# Patient Record
Sex: Female | Born: 1985 | Race: White | Hispanic: No | Marital: Married | Smoking: Never smoker
Health system: Southern US, Community
[De-identification: ages and names within clinical notes are randomized; demographics above are authoritative.]

## PROBLEM LIST (undated history)

## (undated) DIAGNOSIS — I4891 Unspecified atrial fibrillation: Secondary | ICD-10-CM

## (undated) DIAGNOSIS — M797 Fibromyalgia: Secondary | ICD-10-CM

## (undated) DIAGNOSIS — J45909 Unspecified asthma, uncomplicated: Secondary | ICD-10-CM

## (undated) DIAGNOSIS — R7303 Prediabetes: Secondary | ICD-10-CM

## (undated) DIAGNOSIS — D649 Anemia, unspecified: Secondary | ICD-10-CM

## (undated) DIAGNOSIS — E7800 Pure hypercholesterolemia, unspecified: Secondary | ICD-10-CM

## (undated) DIAGNOSIS — F419 Anxiety disorder, unspecified: Secondary | ICD-10-CM

## (undated) DIAGNOSIS — F32A Depression, unspecified: Secondary | ICD-10-CM

## (undated) DIAGNOSIS — I10 Essential (primary) hypertension: Secondary | ICD-10-CM

## (undated) DIAGNOSIS — E282 Polycystic ovarian syndrome: Secondary | ICD-10-CM

## (undated) DIAGNOSIS — E669 Obesity, unspecified: Secondary | ICD-10-CM

## (undated) DIAGNOSIS — K589 Irritable bowel syndrome without diarrhea: Secondary | ICD-10-CM

## (undated) DIAGNOSIS — K219 Gastro-esophageal reflux disease without esophagitis: Secondary | ICD-10-CM

## (undated) DIAGNOSIS — T7840XA Allergy, unspecified, initial encounter: Secondary | ICD-10-CM

## (undated) DIAGNOSIS — I890 Lymphedema, not elsewhere classified: Secondary | ICD-10-CM

## (undated) DIAGNOSIS — M1990 Unspecified osteoarthritis, unspecified site: Secondary | ICD-10-CM

## (undated) HISTORY — DX: Unspecified atrial fibrillation: I48.91

## (undated) HISTORY — DX: Gastro-esophageal reflux disease without esophagitis: K21.9

## (undated) HISTORY — PX: CHOLECYSTECTOMY: SHX55

## (undated) HISTORY — DX: Unspecified osteoarthritis, unspecified site: M19.90

## (undated) HISTORY — DX: Polycystic ovarian syndrome: E28.2

## (undated) HISTORY — DX: Essential (primary) hypertension: I10

## (undated) HISTORY — DX: Fibromyalgia: M79.7

## (undated) HISTORY — DX: Irritable bowel syndrome, unspecified: K58.9

## (undated) HISTORY — DX: Anemia, unspecified: D64.9

## (undated) HISTORY — DX: Unspecified asthma, uncomplicated: J45.909

## (undated) HISTORY — DX: Anxiety disorder, unspecified: F41.9

## (undated) HISTORY — DX: Depression, unspecified: F32.A

## (undated) HISTORY — PX: DILATION AND CURETTAGE OF UTERUS: SHX78

## (undated) HISTORY — DX: Prediabetes: R73.03

## (undated) HISTORY — DX: Pure hypercholesterolemia, unspecified: E78.00

## (undated) HISTORY — DX: Lymphedema, not elsewhere classified: I89.0

## (undated) HISTORY — DX: Obesity, unspecified: E66.9

## (undated) HISTORY — DX: Allergy, unspecified, initial encounter: T78.40XA

## (undated) HISTORY — PX: COLONOSCOPY: SHX174

---

## 2015-07-03 DIAGNOSIS — G5600 Carpal tunnel syndrome, unspecified upper limb: Secondary | ICD-10-CM | POA: Insufficient documentation

## 2015-10-30 DIAGNOSIS — M5412 Radiculopathy, cervical region: Secondary | ICD-10-CM | POA: Insufficient documentation

## 2016-08-27 DIAGNOSIS — N921 Excessive and frequent menstruation with irregular cycle: Secondary | ICD-10-CM | POA: Insufficient documentation

## 2016-08-27 DIAGNOSIS — N840 Polyp of corpus uteri: Secondary | ICD-10-CM | POA: Insufficient documentation

## 2016-12-23 HISTORY — PX: GALLBLADDER SURGERY: SHX652

## 2017-04-16 DIAGNOSIS — K828 Other specified diseases of gallbladder: Secondary | ICD-10-CM | POA: Insufficient documentation

## 2019-07-05 DIAGNOSIS — Q820 Hereditary lymphedema: Secondary | ICD-10-CM | POA: Insufficient documentation

## 2020-07-10 DIAGNOSIS — E7800 Pure hypercholesterolemia, unspecified: Secondary | ICD-10-CM | POA: Insufficient documentation

## 2020-07-10 DIAGNOSIS — R42 Dizziness and giddiness: Secondary | ICD-10-CM | POA: Insufficient documentation

## 2020-10-31 DIAGNOSIS — I83811 Varicose veins of right lower extremities with pain: Secondary | ICD-10-CM | POA: Insufficient documentation

## 2021-02-10 DIAGNOSIS — L4050 Arthropathic psoriasis, unspecified: Secondary | ICD-10-CM | POA: Insufficient documentation

## 2021-03-27 LAB — LAB REPORT - SCANNED
Creatinine, POC: 0.64 mg/dL
EGFR: 115

## 2021-07-07 DIAGNOSIS — I4891 Unspecified atrial fibrillation: Secondary | ICD-10-CM | POA: Insufficient documentation

## 2021-07-07 DIAGNOSIS — F419 Anxiety disorder, unspecified: Secondary | ICD-10-CM | POA: Insufficient documentation

## 2021-07-07 DIAGNOSIS — J45909 Unspecified asthma, uncomplicated: Secondary | ICD-10-CM | POA: Insufficient documentation

## 2022-01-14 ENCOUNTER — Ambulatory Visit: Payer: Self-pay | Admitting: Nurse Practitioner

## 2022-01-16 ENCOUNTER — Other Ambulatory Visit: Payer: Self-pay

## 2022-01-16 ENCOUNTER — Ambulatory Visit (HOSPITAL_BASED_OUTPATIENT_CLINIC_OR_DEPARTMENT_OTHER)
Admission: RE | Admit: 2022-01-16 | Discharge: 2022-01-16 | Disposition: A | Discharge status: Home / Self Care | Payer: 59 | Source: Ambulatory Visit | Attending: Orthopaedic Surgery | Admitting: Orthopaedic Surgery

## 2022-01-16 ENCOUNTER — Ambulatory Visit (INDEPENDENT_AMBULATORY_CARE_PROVIDER_SITE_OTHER): Payer: 59 | Admitting: Orthopaedic Surgery

## 2022-01-16 ENCOUNTER — Other Ambulatory Visit (HOSPITAL_BASED_OUTPATIENT_CLINIC_OR_DEPARTMENT_OTHER): Payer: Self-pay | Admitting: Orthopaedic Surgery

## 2022-01-16 DIAGNOSIS — M25561 Pain in right knee: Secondary | ICD-10-CM | POA: Diagnosis present

## 2022-01-16 DIAGNOSIS — M222X1 Patellofemoral disorders, right knee: Secondary | ICD-10-CM | POA: Diagnosis not present

## 2022-01-16 DIAGNOSIS — G8929 Other chronic pain: Secondary | ICD-10-CM | POA: Diagnosis not present

## 2022-01-16 NOTE — Progress Notes (Signed)
Chief Complaint: right knee pain     History of Present Illness:    Phyllis Gentry is a 36 y.o. female presents with right knee pain going on since November.  She did not have any specific injury or incident.  She states that getting up out of the mattress really aggravated it.  She has previously been seen [facility] who told her that her diagnosis was osteoarthritis.  She does endorse some clicking and popping particularly behind the kneecap.  She is not had any injections.  She continues to complain of knee buckling.  She is unable to trial anti-inflammatories as she has severe allergies to medications.  She uses an over-the-counter knee sleeve.  She states that the pain really has been at baseline since she was 14    Surgical History:   None  PMH/PSH/Family History/Social History/Meds/Allergies:   No past medical history on file.  Social History   Socioeconomic History   Marital status: Married    Spouse name: Not on file   Number of children: Not on file   Years of education: Not on file   Highest education level: Not on file  Occupational History   Not on file  Tobacco Use   Smoking status: Not on file   Smokeless tobacco: Not on file  Substance and Sexual Activity   Alcohol use: Not on file   Drug use: Not on file   Sexual activity: Not on file  Other Topics Concern   Not on file  Social History Narrative   Not on file   Social Determinants of Health   Financial Resource Strain: Not on file  Food Insecurity: Not on file  Transportation Needs: Not on file  Physical Activity: Not on file  Stress: Not on file  Social Connections: Not on file   No family history on file. Not on File No current outpatient medications on file.   No current facility-administered medications for this visit.   No results found.  Review of Systems:   A ROS was performed including pertinent positives and negatives as  documented in the HPI.  Physical Exam :   Constitutional: NAD and appears stated age Neurological: Alert and oriented Psych: Appropriate affect and cooperative There were no vitals taken for this visit.   Comprehensive Musculoskeletal Exam:      Musculoskeletal Exam  Gait Normal  Alignment Normal   Right Left  Inspection Normal Normal  Palpation    Tenderness Patellofemoral None  Crepitus None None  Effusion None None  Range of Motion    Extension 0 0  Flexion 135 135  Strength    Extension 5/5 5/5  Flexion 5/5 5/5  Ligament Exam     Generalized Laxity No No  Lachman Negative Negative   Pivot Shift Negative Negative  Anterior Drawer Negative Negative  Valgus at 0 Negative Negative  Valgus at 20 Negative Negative  Varus at 0 0 0  Varus at 20   0 0  Posterior Drawer at 90 0 0  Vascular/Lymphatic Exam    Edema None None  Venous Stasis Changes No No  Distal Circulation Normal Normal  Neurologic    Light Touch Sensation Intact Intact  Special Tests:      Imaging:   Xray (4 views right knee): Normal  I personally reviewed and interpreted the radiographs.   Assessment:   36 year old female with persistent chronic right knee pain since she was 38 which is really been aggravating her for the last several months.  At this time given the fact that this pain has been going on for greater than a decade I do think an MRI is indicated to further assess the status of her cartilage and to see if she would be a candidate for any specific additional surgical intervention.  In terms of her diagnosis, her symptoms do appear to be coming from the patellofemoral joint at this point.  I would like her to work on a home exercise program of quadricep strengthening as well as possibly obtain an over-the-counter patella sleeve  Plan :    -Plan for MRI right knee   I believe that advance imaging in the form of an MRI is indicated for the following reasons: -Xrays images were  obtained and not diagnostic -The patient has failed treatment modalities including activity restriction, bracing, unable to take anti-inflammatories due to allergies -The following worrisome symptoms are present on history and exam: Pain and swelling consistent with possible cartilage injuries behind the knee - MRI is required to assist in specific surgical planning       I personally saw and evaluated the patient, and participated in the management and treatment plan.  Huel Cote, MD Attending Physician, Orthopedic Surgery  This document was dictated using Dragon voice recognition software. A reasonable attempt at proof reading has been made to minimize errors.

## 2022-01-25 ENCOUNTER — Other Ambulatory Visit: Payer: Self-pay

## 2022-01-25 ENCOUNTER — Ambulatory Visit (INDEPENDENT_AMBULATORY_CARE_PROVIDER_SITE_OTHER): Payer: 59 | Admitting: Nurse Practitioner

## 2022-01-25 ENCOUNTER — Encounter: Payer: Self-pay | Admitting: Nurse Practitioner

## 2022-01-25 VITALS — BP 101/70 | HR 80 | Temp 97.3°F | Ht 64.0 in | Wt 294.0 lb

## 2022-01-25 DIAGNOSIS — K580 Irritable bowel syndrome with diarrhea: Secondary | ICD-10-CM

## 2022-01-25 DIAGNOSIS — Z7689 Persons encountering health services in other specified circumstances: Secondary | ICD-10-CM

## 2022-01-25 DIAGNOSIS — I890 Lymphedema, not elsewhere classified: Secondary | ICD-10-CM | POA: Diagnosis not present

## 2022-01-25 DIAGNOSIS — Z8679 Personal history of other diseases of the circulatory system: Secondary | ICD-10-CM | POA: Diagnosis not present

## 2022-01-25 DIAGNOSIS — I10 Essential (primary) hypertension: Secondary | ICD-10-CM | POA: Insufficient documentation

## 2022-01-25 DIAGNOSIS — Z6841 Body Mass Index (BMI) 40.0 and over, adult: Secondary | ICD-10-CM

## 2022-01-25 DIAGNOSIS — M797 Fibromyalgia: Secondary | ICD-10-CM | POA: Insufficient documentation

## 2022-01-25 DIAGNOSIS — E282 Polycystic ovarian syndrome: Secondary | ICD-10-CM

## 2022-01-25 MED ORDER — DICYCLOMINE HCL 20 MG PO TABS: 20.0000 mg | ORAL_TABLET | Freq: Four times a day (QID) | ORAL | 1 refills | Status: DC

## 2022-01-25 NOTE — Patient Instructions (Addendum)
Managing Anxiety, Adult °After being diagnosed with anxiety, you may be relieved to know why you have felt or behaved a certain way. You may also feel overwhelmed about the treatment ahead and what it will mean for your life. With care and support, you can manage this condition. °How to manage lifestyle changes °Managing stress and anxiety °Stress is your body's reaction to life changes and events, both good and bad. Most stress will last just a few hours, but stress can be ongoing and can lead to more than just stress. Although stress can play a major role in anxiety, it is not the same as anxiety. Stress is usually caused by something external, such as a deadline, test, or competition. Stress normally passes after the triggering event has ended.  °Anxiety is caused by something internal, such as imagining a terrible outcome or worrying that something will go wrong that will devastate you. Anxiety often does not go away even after the triggering event is over, and it can become long-term (chronic) worry. It is important to understand the differences between stress and anxiety and to manage your stress effectively so that it does not lead to an anxious response. °Talk with your health care provider or a counselor to learn more about reducing anxiety and stress. He or she may suggest tension reduction techniques, such as: °Music therapy. Spend time creating or listening to music that you enjoy and that inspires you. °Mindfulness-based meditation. Practice being aware of your normal breaths while not trying to control your breathing. It can be done while sitting or walking. °Centering prayer. This involves focusing on a word, phrase, or sacred image that means something to you and brings you peace. °Deep breathing. To do this, expand your stomach and inhale slowly through your nose. Hold your breath for 3-5 seconds. Then exhale slowly, letting your stomach muscles relax. °Self-talk. Learn to notice and identify  thought patterns that lead to anxiety reactions and change those patterns to thoughts that feel peaceful. °Muscle relaxation. Taking time to tense muscles and then relax them. °Choose a tension reduction technique that fits your lifestyle and personality. These techniques take time and practice. Set aside 5-15 minutes a day to do them. Therapists can offer counseling and training in these techniques. The training to help with anxiety may be covered by some insurance plans. °Other things you can do to manage stress and anxiety include: °Keeping a stress diary. This can help you learn what triggers your reaction and then learn ways to manage your response. °Thinking about how you react to certain situations. You may not be able to control everything, but you can control your response. °Making time for activities that help you relax and not feeling guilty about spending your time in this way. °Doing visual imagery. This involves imagining or creating mental pictures to help you relax. °Practicing yoga. Through yoga poses, you can lower tension and promote relaxation. ° °Medicines °Medicines can help ease symptoms. Medicines for anxiety include: °Antidepressant medicines. These are usually prescribed for long-term daily control. °Anti-anxiety medicines. These may be added in severe cases, especially when panic attacks occur. °Medicines will be prescribed by a health care provider. When used together, medicines, psychotherapy, and tension reduction techniques may be the most effective treatment. °Relationships °Relationships can play a big part in helping you recover. Try to spend more time connecting with trusted friends and family members. °Consider going to couples counseling if you have a partner, taking family education classes, or going to family   therapy. °Therapy can help you and others better understand your condition. °How to recognize changes in your anxiety °Everyone responds differently to treatment for  anxiety. Recovery from anxiety happens when symptoms decrease and stop interfering with your daily activities at home or work. This may mean that you will start to: °Have better concentration and focus. Worry will interfere less in your daily thinking. °Sleep better. °Be less irritable. °Have more energy. °Have improved memory. °It is also important to recognize when your condition is getting worse. Contact your health care provider if your symptoms interfere with home or work and you feel like your condition is not improving. °Follow these instructions at home: °Activity °Exercise. Adults should do the following: °Exercise for at least 150 minutes each week. The exercise should increase your heart rate and make you sweat (moderate-intensity exercise). °Strengthening exercises at least twice a week. °Get the right amount and quality of sleep. Most adults need 7-9 hours of sleep each night. °Lifestyle ° °Eat a healthy diet that includes plenty of vegetables, fruits, whole grains, low-fat dairy products, and lean protein. °Do not eat a lot of foods that are high in fats, added sugars, or salt (sodium). °Make choices that simplify your life. °Do not use any products that contain nicotine or tobacco. These products include cigarettes, chewing tobacco, and vaping devices, such as e-cigarettes. If you need help quitting, ask your health care provider. °Avoid caffeine, alcohol, and certain over-the-counter cold medicines. These may make you feel worse. Ask your pharmacist which medicines to avoid. °General instructions °Take over-the-counter and prescription medicines only as told by your health care provider. °Keep all follow-up visits. This is important. °Where to find support °You can get help and support from these sources: °Self-help groups. °Online and community organizations. °A trusted spiritual leader. °Couples counseling. °Family education classes. °Family therapy. °Where to find more information °You may find  that joining a support group helps you deal with your anxiety. The following sources can help you locate counselors or support groups near you: °[employer] [place]: www.mentalhealth[place]ica.net °Anxiety and Depression Association of [place] (ADAA): www.adaa.org °National Alliance on Mental Illness (NAMI): www.nami.org °Contact a health care provider if: °You have a hard time staying focused or finishing daily tasks. °You spend many hours a day feeling worried about everyday life. °You become exhausted by worry. °You start to have headaches or frequently feel tense. °You develop chronic nausea or diarrhea. °Get help right away if: °You have a racing heart and shortness of breath. °You have thoughts of hurting yourself or others. °If you ever feel like you may hurt yourself or others, or have thoughts about taking your own life, get help right away. Go to your nearest emergency department or: °Call your local emergency services (911 in the [place] °Call a suicide crisis helpline, such as the National Suicide Prevention Lifeline at 1-800-273-8255 or 988 in the [place] This is open 24 hours a day in the [place] °Text the Crisis Text Line at 741741 (in the [place] °Summary °Taking steps to learn and use tension reduction techniques can help calm you and help prevent triggering an anxiety reaction. °When used together, medicines, psychotherapy, and tension reduction techniques may be the most effective treatment. °Family, friends, and partners can play a big part in supporting you. °This information is not intended to replace advice given to you by your health care provider. Make sure you discuss any questions you have with your health care provider. °Document Revised: 07/04/2021 Document Reviewed: 04/01/2021 °Elsevier Patient   Education © 2022 [employer] ° °Fat and Cholesterol Restricted Eating Plan °Getting too much fat and cholesterol in your diet may cause health problems. Choosing the right foods helps keep your fat and  cholesterol at normal levels. This can keep you from getting certain diseases. °Your doctor may recommend an eating plan that includes: °Total fat: ______% or less of total calories a day. This is ______g of fat a day. °Saturated fat: ______% or less of total calories a day. This is ______g of saturated fat a day. °Cholesterol: less than _________mg a day. °Fiber: ______g a day. °What are tips for following this plan? °General tips °Work with your doctor to lose weight if you need to. °Avoid: °Foods with added sugar. °Fried foods. °Foods with trans fat or partially hydrogenated oils. This includes some margarines and baked goods. °If you drink alcohol: °Limit how much you have to: °0-1 drink a day for women who are not pregnant. °0-2 drinks a day for men. °Know how much alcohol is in a drink. In the [place] one drink equals one 12 oz bottle of beer (355 mL), one 5 oz glass of wine (148 mL), or one 1½ oz glass of hard liquor (44 mL). °Reading food labels °Check food labels for: °Trans fats. °Partially hydrogenated oils. °Saturated fat (g) in each serving. °Cholesterol (mg) in each serving. °Fiber (g) in each serving. °Choose foods with healthy fats, such as: °Monounsaturated fats and polyunsaturated fats. These include olive and canola oil, flaxseeds, walnuts, almonds, and seeds. °Omega-3 fats. These are found in certain fish, flaxseed oil, and ground flaxseeds. °Choose grain products that have whole grains. Look for the word "whole" as the first word in the ingredient list. °Cooking °Cook foods using low-fat methods. These include baking, boiling, grilling, and broiling. °Eat more home-cooked foods. Eat at restaurants and buffets less often. Eat less fast food. °Avoid cooking using saturated fats, such as butter, cream, palm oil, palm kernel oil, and coconut oil. °Meal planning ° °At meals, divide your plate into four equal parts: °Fill one-half of your plate with vegetables, green salads, and fruit. °Fill one-fourth  of your plate with whole grains. °Fill one-fourth of your plate with low-fat (lean) protein foods. °Eat fish that is high in omega-3 fats at least two times a week. This includes mackerel, tuna, sardines, and salmon. °Eat foods that are high in fiber, such as whole grains, beans, apples, pears, berries, broccoli, carrots, peas, and barley. °What foods should I eat? °Fruits °All fresh, canned (in natural juice), or frozen fruits. °Vegetables °Fresh or frozen vegetables (raw, steamed, roasted, or grilled). Green salads. °Grains °Whole grains, such as whole wheat or whole grain breads, crackers, cereals, and pasta. Unsweetened oatmeal, bulgur, barley, quinoa, or brown rice. Corn or whole wheat flour tortillas. °Meats and other protein foods °Ground beef (85% or leaner), grass-fed beef, or beef trimmed of fat. Skinless chicken or turkey. Ground chicken or turkey. Pork trimmed of fat. All fish and seafood. Egg whites. Dried beans, peas, or lentils. Unsalted nuts or seeds. Unsalted canned beans. Nut butters without added sugar or oil. °Dairy °Low-fat or nonfat dairy products, such as skim or 1% milk, 2% or reduced-fat cheeses, low-fat and fat-free ricotta or cottage cheese, or plain low-fat and nonfat yogurt. °Fats and oils °Tub margarine without trans fats. Light or reduced-fat mayonnaise and salad dressings. Avocado. Olive, canola, sesame, or safflower oils. °The items listed above may not be a complete list of foods and beverages you can eat. Contact   a dietitian for more information. °What foods should I avoid? °Fruits °Canned fruit in heavy syrup. Fruit in cream or butter sauce. Fried fruit. °Vegetables °Vegetables cooked in cheese, cream, or butter sauce. Fried vegetables. °Grains °White bread. White pasta. White rice. Cornbread. Bagels, pastries, and croissants. Crackers and snack foods that contain trans fat and hydrogenated oils. °Meats and other protein foods °Fatty cuts of meat. Ribs, chicken wings, bacon,  sausage, bologna, salami, chitterlings, fatback, hot dogs, bratwurst, and packaged lunch meats. Liver and organ meats. Whole eggs and egg yolks. Chicken and turkey with skin. Fried meat. °Dairy °Whole or 2% milk, cream, half-and-half, and cream cheese. Whole milk cheeses. Whole-fat or sweetened yogurt. Full-fat cheeses. Nondairy creamers and whipped toppings. Processed cheese, cheese spreads, and cheese curds. °Fats and oils °Butter, stick margarine, lard, shortening, ghee, or bacon fat. Coconut, palm kernel, and palm oils. °Beverages °Alcohol. Sugar-sweetened drinks such as sodas, lemonade, and fruit drinks. °Sweets and desserts °Corn syrup, sugars, honey, and molasses. Candy. Jam and jelly. Syrup. Sweetened cereals. Cookies, pies, cakes, donuts, muffins, and ice cream. °The items listed above may not be a complete list of foods and beverages you should avoid. Contact a dietitian for more information. °Summary °Choosing the right foods helps keep your fat and cholesterol at normal levels. This can keep you from getting certain diseases. °At meals, fill one-half of your plate with vegetables, green salads, and fruits. °Eat high fiber foods, like whole grains, beans, apples, pears, berries, carrots, peas, and barley. °Limit added sugar, saturated fats, alcohol, and fried foods. °This information is not intended to replace advice given to you by your health care provider. Make sure you discuss any questions you have with your health care provider. °Document Revised: 04/20/2021 Document Reviewed: 04/20/2021 °Elsevier Patient Education © 2022 [employer] ° °

## 2022-01-25 NOTE — Progress Notes (Signed)
New Patient Office Visit  Subjective:  Patient ID: Phyllis Gentry, female    DOB: 16-Nov-1986  Age: 35 y.o. MRN: [id number]  CC:  Chief Complaint  Patient presents with   New Patient (Initial Visit)    HPI Phyllis Gentry presents to establish new primary care provider. She has moved to the area from [place].  He has extensive medical history. States that she was hospitalized a great deal as a child. She has asthma and severe asthma.  2018 - Has history of atrial fibrillation and severe hypothyroid. She states that a week later she had to have her gallbladder removed.  She is not taking anything for atrial fibrillation. Has not had incidence since the first one. Has not had thyroid levels checked since this time. Mother has hashimoto's thyroiditis. She states that all the female members of her family have thyroid issues. 2020 - diagnosed with psoriatic arthritis. Previously had been diagnosed with fibromyalgia.  2020 - lymphedema in both legs. Worse on left side than right.  10/2021 - moved to [place] - started waking up with very dry mouth and very groggy. Does have family history of diabetes. Bought glucose meter. Fasting sugars have been running in the 110s and 120s.  - increased anxiety. Causes her to have extreme physical pain. Currently on lexapro 10, had been doing well on lexapro 5mg  and when moved up to 10 mg, started having bad acid reflux. Due to this, she has cut back to 1/2 tablet daily.  -history of PCOS. Has very irregular menstrual periods.     History reviewed. No pertinent past medical history.  History reviewed. No pertinent surgical history.  History reviewed. No pertinent family history.  Social History   Socioeconomic History   Marital status: Married    Spouse name: Not on file   Number of children: Not on file   Years of education: Not on file   Highest education level: Not on file  Occupational History   Not on file  Tobacco Use    Smoking status: Never   Smokeless tobacco: Never  Vaping Use   Vaping Use: Never used  Substance and Sexual Activity   Alcohol use: Not Currently   Drug use: Not Currently   Sexual activity: Not Currently  Other Topics Concern   Not on file  Social History Narrative   Not on file   Social Determinants of Health   Financial Resource Strain: Not on file  Food Insecurity: Not on file  Transportation Needs: Not on file  Physical Activity: Not on file  Stress: Not on file  Social Connections: Not on file  Intimate Partner Violence: Not on file    ROS Review of Systems  Constitutional:  Positive for fatigue. Negative for activity change, appetite change, chills and fever.  HENT:  Negative for congestion, postnasal drip, rhinorrhea, sinus pressure, sinus pain, sneezing and sore throat.   Eyes: Negative.   Respiratory:  Negative for cough, chest tightness, shortness of breath and wheezing.   Cardiovascular:  Positive for leg swelling. Negative for chest pain and palpitations.  Gastrointestinal:  Negative for abdominal pain, constipation, diarrhea, nausea and vomiting.  Endocrine: Negative for cold intolerance, heat intolerance, polydipsia and polyuria.  Genitourinary:  Positive for menstrual problem. Negative for dyspareunia, dysuria, flank pain, frequency and urgency.  Musculoskeletal:  Positive for arthralgias and myalgias. Negative for back pain.  Skin:  Negative for rash.  Allergic/Immunologic: Negative for environmental allergies.  Neurological:  Negative for dizziness,  weakness and headaches.  Hematological:  Negative for adenopathy.  Psychiatric/Behavioral:  The patient is nervous/anxious.    Objective:   Today's Vitals   01/25/22 1107  BP: 101/70  Pulse: 80  Temp: (!) 97.3 F (36.3 C)  SpO2: 97%  Weight: 294 lb (133.4 kg)  Height: 5\' 4"  (1.626 m)   Body mass index is 50.46 kg/m.   Physical Exam Vitals and nursing note reviewed.  Constitutional:       Appearance: Normal appearance. She is well-developed. She is obese.  HENT:     Head: Normocephalic and atraumatic.     Nose: Nose normal.     Mouth/Throat:     Mouth: Mucous membranes are moist.     Pharynx: Oropharynx is clear.  Eyes:     Conjunctiva/sclera: Conjunctivae normal.     Pupils: Pupils are equal, round, and reactive to light.  Cardiovascular:     Rate and Rhythm: Normal rate and regular rhythm.     Pulses: Normal pulses.     Heart sounds: Normal heart sounds.  Pulmonary:     Effort: Pulmonary effort is normal.     Breath sounds: Normal breath sounds.  Abdominal:     Palpations: Abdomen is soft.  Musculoskeletal:        General: Normal range of motion.     Cervical back: Normal range of motion and neck supple.  Lymphadenopathy:     Cervical: No cervical adenopathy.  Skin:    General: Skin is warm and dry.     Capillary Refill: Capillary refill takes less than 2 seconds.  Neurological:     General: No focal deficit present.     Mental Status: She is alert and oriented to person, place, and time.  Psychiatric:        Mood and Affect: Mood normal.        Behavior: Behavior normal.        Thought Content: Thought content normal.        Judgment: Judgment normal.    Assessment & Plan:  1. Encounter to establish care Appointment today to establish new primary care provider.  We will get medical records from previous providers to review and update patient chart.  2. Irritable bowel syndrome with diarrhea Continue dicyclomine 20 mg tablets up to 4 times daily if needed for abdominal cramping and diarrhea. - dicyclomine (BENTYL) 20 MG tablet; Take 1 tablet (20 mg total) by mouth every 6 (six) hours.  Dispense: 90 tablet; Refill: 1  3. H/O atrial fibrillation without current medication Refer to cardiology for surveillance and management. - Ambulatory referral to Cardiology  4. Lymphedema Refer to cardiology for further evaluation and treatment. - Ambulatory  referral to Cardiology  5. Polycystic ovary disease Refer to gynecology for further evaluation and treatment. - Ambulatory referral to Gynecology  6. Body mass index (BMI) of 50-59.9 in adult [facility](HCC) Discussed lowering calorie intake to 1500 calories per day and incorporating exercise into daily routine to help lose weight.    Problem List Items Addressed This Visit       Digestive   Irritable bowel syndrome with diarrhea   Relevant Medications   dicyclomine (BENTYL) 20 MG tablet     Endocrine   Polycystic ovary disease   Relevant Orders   Ambulatory referral to Gynecology     Other   H/O atrial fibrillation without current medication   Relevant Orders   Ambulatory referral to Cardiology   Lymphedema   Relevant Orders   Ambulatory  referral to Cardiology   Body mass index (BMI) of 50-59.9 in adult [facility])   Other Visit Diagnoses     Encounter to establish care    -  Primary       Outpatient Encounter Medications as of 01/25/2022  Medication Sig   escitalopram (LEXAPRO) 10 MG tablet Take 10 mg by mouth daily.   propranolol (INDERAL) 40 MG tablet propranolol 40 mg tablet  Take 1 tablet twice a day by oral route.   [DISCONTINUED] dicyclomine (BENTYL) 20 MG tablet Take 20 mg by mouth every 6 (six) hours.   dicyclomine (BENTYL) 20 MG tablet Take 1 tablet (20 mg total) by mouth every 6 (six) hours.   No facility-administered encounter medications on file as of 01/25/2022.    Follow-up: Return in about 4 weeks (around 02/22/2022) for Chronic Concerns need records from [place] wil get labs next monday or tuesday (fasting).   Carlean Jews, NP

## 2022-01-27 ENCOUNTER — Encounter: Payer: Self-pay | Admitting: Nurse Practitioner

## 2022-01-27 DIAGNOSIS — K580 Irritable bowel syndrome with diarrhea: Secondary | ICD-10-CM | POA: Insufficient documentation

## 2022-01-27 DIAGNOSIS — E282 Polycystic ovarian syndrome: Secondary | ICD-10-CM | POA: Insufficient documentation

## 2022-01-27 DIAGNOSIS — Z6841 Body Mass Index (BMI) 40.0 and over, adult: Secondary | ICD-10-CM | POA: Insufficient documentation

## 2022-01-27 DIAGNOSIS — Z8679 Personal history of other diseases of the circulatory system: Secondary | ICD-10-CM | POA: Insufficient documentation

## 2022-01-27 DIAGNOSIS — I890 Lymphedema, not elsewhere classified: Secondary | ICD-10-CM | POA: Insufficient documentation

## 2022-01-28 ENCOUNTER — Other Ambulatory Visit: Payer: Self-pay

## 2022-01-28 ENCOUNTER — Ambulatory Visit (HOSPITAL_COMMUNITY)
Admission: RE | Admit: 2022-01-28 | Discharge: 2022-01-28 | Disposition: A | Discharge status: Home / Self Care | Payer: 59 | Source: Ambulatory Visit | Attending: Orthopaedic Surgery | Admitting: Orthopaedic Surgery

## 2022-01-28 ENCOUNTER — Other Ambulatory Visit: Payer: 59

## 2022-01-28 ENCOUNTER — Other Ambulatory Visit: Payer: Self-pay | Admitting: Nurse Practitioner

## 2022-01-28 DIAGNOSIS — L4050 Arthropathic psoriasis, unspecified: Secondary | ICD-10-CM

## 2022-01-28 DIAGNOSIS — R635 Abnormal weight gain: Secondary | ICD-10-CM

## 2022-01-28 DIAGNOSIS — G8929 Other chronic pain: Secondary | ICD-10-CM | POA: Diagnosis present

## 2022-01-28 DIAGNOSIS — R5383 Other fatigue: Secondary | ICD-10-CM

## 2022-01-28 DIAGNOSIS — Z0000 Encounter for general adult medical examination without abnormal findings: Secondary | ICD-10-CM

## 2022-01-28 DIAGNOSIS — Z13228 Encounter for screening for other metabolic disorders: Secondary | ICD-10-CM

## 2022-01-28 DIAGNOSIS — Z130 Encounter for screening for diseases of the blood and blood-forming organs and certain disorders involving the immune mechanism: Secondary | ICD-10-CM

## 2022-01-28 DIAGNOSIS — M2550 Pain in unspecified joint: Secondary | ICD-10-CM

## 2022-01-28 DIAGNOSIS — E559 Vitamin D deficiency, unspecified: Secondary | ICD-10-CM

## 2022-01-28 DIAGNOSIS — M25561 Pain in right knee: Secondary | ICD-10-CM | POA: Insufficient documentation

## 2022-01-28 DIAGNOSIS — Z862 Personal history of diseases of the blood and blood-forming organs and certain disorders involving the immune mechanism: Secondary | ICD-10-CM

## 2022-01-28 DIAGNOSIS — Z7689 Persons encountering health services in other specified circumstances: Secondary | ICD-10-CM

## 2022-01-29 LAB — RHEUMATOID FACTOR: Rheumatoid fact SerPl-aCnc: <10 IU/mL (ref <14.0)

## 2022-01-29 LAB — ANA W/REFLEX IF POSITIVE: Anti Nuclear Antibody (ANA): NEGATIVE

## 2022-01-29 LAB — SEDIMENTATION RATE: Sed Rate: 68 mm/hr — ABNORMAL HIGH (ref 0–32)

## 2022-01-29 LAB — FERRITIN: Ferritin: 56 ng/mL (ref 15–150)

## 2022-01-29 LAB — VITAMIN B12: Vitamin B-12: 1134 pg/mL (ref 232–1245)

## 2022-01-29 LAB — VITAMIN D 25 HYDROXY (VIT D DEFICIENCY, FRACTURES): Vit D, 25-Hydroxy: 42.2 ng/mL (ref 30.0–100.0)

## 2022-01-29 NOTE — Progress Notes (Signed)
Sed rate mildly elevated. Waiting on other results.

## 2022-01-30 ENCOUNTER — Encounter: Payer: Self-pay | Admitting: Nurse Practitioner

## 2022-01-30 LAB — LIPID PANEL W/O CHOL/HDL RATIO
Cholesterol, Total: 220 mg/dL — ABNORMAL HIGH (ref 100–199)
HDL: 43 mg/dL (ref >39)
LDL Chol Calc (NIH): 153 mg/dL — ABNORMAL HIGH (ref 0–99)
Triglycerides: 134 mg/dL (ref 0–149)
VLDL Cholesterol Cal: 24 mg/dL (ref 5–40)

## 2022-01-30 LAB — CBC
Hematocrit: 42.2 % (ref 34.0–46.6)
Hemoglobin: 12.9 g/dL (ref 11.1–15.9)
MCH: 27 pg (ref 26.6–33.0)
MCHC: 30.6 g/dL — ABNORMAL LOW (ref 31.5–35.7)
MCV: 88 fL (ref 79–97)
Platelets: 446 10*3/uL (ref 150–450)
RBC: 4.78 x10E6/uL (ref 3.77–5.28)
RDW: 14.7 % (ref 11.7–15.4)
WBC: 9.2 10*3/uL (ref 3.4–10.8)

## 2022-01-30 LAB — HGB A1C W/O EAG: Hgb A1c MFr Bld: 6 % — ABNORMAL HIGH (ref 4.8–5.6)

## 2022-01-30 LAB — COMPREHENSIVE METABOLIC PANEL
ALT: 9 IU/L (ref 0–32)
AST: 12 IU/L (ref 0–40)
Albumin/Globulin Ratio: 1.2 (ref 1.2–2.2)
Albumin: 3.9 g/dL (ref 3.8–4.8)
Alkaline Phosphatase: 93 IU/L (ref 44–121)
BUN/Creatinine Ratio: 17 (ref 9–23)
BUN: 12 mg/dL (ref 6–20)
Bilirubin Total: 0.2 mg/dL (ref 0.0–1.2)
CO2: 22 mmol/L (ref 20–29)
Calcium: 9.3 mg/dL (ref 8.7–10.2)
Chloride: 99 mmol/L (ref 96–106)
Creatinine, Ser: 0.69 mg/dL (ref 0.57–1.00)
Globulin, Total: 3.3 g/dL (ref 1.5–4.5)
Glucose: 104 mg/dL — ABNORMAL HIGH (ref 70–99)
Potassium: 4.6 mmol/L (ref 3.5–5.2)
Sodium: 137 mmol/L (ref 134–144)
Total Protein: 7.2 g/dL (ref 6.0–8.5)
eGFR: 115 mL/min/{1.73_m2} (ref >59)

## 2022-01-30 LAB — TSH: TSH: 1.76 u[IU]/mL (ref 0.450–4.500)

## 2022-01-30 LAB — SPECIMEN STATUS REPORT

## 2022-01-30 NOTE — Progress Notes (Signed)
MyChart message sent to patient:  our bad and total cholesterol are slightly elevated. I recommend avoiding fried and fatty goods and increasing intake of lean meats and green leafy vegetables. Try incorporating exercise into daily routine.  Your HgbA1c was also a bit elevated at 6.0, indicating increased risk for developing diabetes. Limiting carbs and sugar in your diet should help decrease this, along with lifestyle changes to lower cholesterol. The Sed Rate was a little high. This indicated inflammation in the body. On the positivie side, your ANA and RA factors were both negative. All other results were essentially normal.

## 2022-02-04 ENCOUNTER — Encounter: Payer: Self-pay | Admitting: Nurse Practitioner

## 2022-02-13 ENCOUNTER — Other Ambulatory Visit: Payer: Self-pay

## 2022-02-13 ENCOUNTER — Ambulatory Visit (INDEPENDENT_AMBULATORY_CARE_PROVIDER_SITE_OTHER): Payer: 59 | Admitting: Orthopaedic Surgery

## 2022-02-13 DIAGNOSIS — M222X1 Patellofemoral disorders, right knee: Secondary | ICD-10-CM | POA: Diagnosis not present

## 2022-02-13 NOTE — Progress Notes (Signed)
Chief Complaint: right knee pain     History of Present Illness:   02/13/2022: Presents today for MRI follow-up of the right knee.  Knee continues to be persistently painful.  She recently went to [employer] center and had a very difficult time with significant pain in the knee following.   Phyllis Gentry is a 36 y.o. female presents with right knee pain going on since November.  She did not have any specific injury or incident.  She states that getting up out of the mattress really aggravated it.  [facility] who told her that her diagnosis was osteoarthritis.  She does endorse some clicking and popping particularly behind the kneecap.  She is not had any injections.  She continues to complain of knee buckling.  She is unable to trial anti-inflammatories as she has severe allergies to medications.  She uses an over-the-counter knee sleeve.  She states that the pain really has been at baseline since she was 14    Surgical History:   None  PMH/PSH/Family History/Social History/Meds/Allergies:   No past medical history on file.  Social History   Socioeconomic History   Marital status: Married    Spouse name: Not on file   Number of children: Not on file   Years of education: Not on file   Highest education level: Not on file  Occupational History   Not on file  Tobacco Use   Smoking status: Never   Smokeless tobacco: Never  Vaping Use   Vaping Use: Never used  Substance and Sexual Activity   Alcohol use: Not Currently   Drug use: Not Currently   Sexual activity: Not Currently  Other Topics Concern   Not on file  Social History Narrative   Not on file   Social Determinants of Health   Financial Resource Strain: Not on file  Food Insecurity: Not on file  Transportation Needs: Not on file  Physical Activity: Not on file  Stress: Not on file  Social Connections: Not on file   No  family history on file. Allergies  Allergen Reactions   Aminophylline Anaphylaxis   Cefaclor Anaphylaxis   Cephalosporins Anaphylaxis   Sulfa Antibiotics Anaphylaxis   Clarithromycin    Iodinated Contrast Media    Current Outpatient Medications  Medication Sig Dispense Refill   dicyclomine (BENTYL) 20 MG tablet Take 1 tablet (20 mg total) by mouth every 6 (six) hours. 90 tablet 1   escitalopram (LEXAPRO) 10 MG tablet Take 10 mg by mouth daily.     propranolol (INDERAL) 40 MG tablet propranolol 40 mg tablet  Take 1 tablet twice a day by oral route.     No current facility-administered medications for this visit.   No results found.  Review of Systems:   A ROS was performed including pertinent positives and negatives as documented in the HPI.  Physical Exam :   Constitutional: NAD and appears stated age Neurological: Alert and oriented Psych: Appropriate affect and cooperative There were no vitals taken for this visit.   Comprehensive Musculoskeletal Exam:      Musculoskeletal Exam  Gait Normal  Alignment Normal   Right Left  Inspection Normal Normal  Palpation    Tenderness Patellofemoral None  Crepitus None None  Effusion None None  Range of Motion    Extension 0 0  Flexion 135 135  Strength    Extension 5/5 5/5  Flexion 5/5 5/5  Ligament Exam     Generalized Laxity No No  Lachman Negative Negative   Pivot Shift Negative Negative  Anterior Drawer Negative Negative  Valgus at 0 Negative Negative  Valgus at 20 Negative Negative  Varus at 0 0 0  Varus at 20   0 0  Posterior Drawer at 90 0 0  Vascular/Lymphatic Exam    Edema None None  Venous Stasis Changes No No  Distal Circulation Normal Normal  Neurologic    Light Touch Sensation Intact Intact  Special Tests:      Imaging:   Xray (4 views right knee): Normal  MRI right knee: Significant lateral patellar tilt and otherwise normal  I personally reviewed and interpreted the  radiographs.   Assessment:   36 year old female with persistent chronic right knee pain since she was 39 which is really been aggravating her for the last several months.  Her exam and MRI are consistent with a patellofemoral etiology of pain.  At this time I would specifically like her to participate in physical therapy to be given a VMO and hip strengthening program which she can work on at home multiple times a week.  I advised her on kinesiotaping so that she can improve her lateral patellar tilt.  Plan :    -Physical therapy for improving VMO strength as well as hip strength -Recommend kinesiotaping to improve lateral patellar tilt Return to clinic in 2 months     I personally saw and evaluated the patient, and participated in the management and treatment plan.  Huel Cote, MD Attending Physician, Orthopedic Surgery  This document was dictated using Dragon voice recognition software. A reasonable attempt at proof reading has been made to minimize errors.

## 2022-02-14 ENCOUNTER — Other Ambulatory Visit: Payer: Self-pay | Admitting: Nurse Practitioner

## 2022-02-14 DIAGNOSIS — Z1231 Encounter for screening mammogram for malignant neoplasm of breast: Secondary | ICD-10-CM

## 2022-02-15 ENCOUNTER — Ambulatory Visit (INDEPENDENT_AMBULATORY_CARE_PROVIDER_SITE_OTHER): Payer: 59 | Admitting: Cardiology

## 2022-02-15 ENCOUNTER — Encounter (HOSPITAL_BASED_OUTPATIENT_CLINIC_OR_DEPARTMENT_OTHER): Payer: Self-pay | Admitting: Cardiology

## 2022-02-15 ENCOUNTER — Other Ambulatory Visit: Payer: Self-pay

## 2022-02-15 VITALS — BP 140/82 | HR 85 | Ht 64.0 in | Wt 295.2 lb

## 2022-02-15 DIAGNOSIS — E8881 Metabolic syndrome: Secondary | ICD-10-CM

## 2022-02-15 DIAGNOSIS — I890 Lymphedema, not elsewhere classified: Secondary | ICD-10-CM

## 2022-02-15 DIAGNOSIS — R030 Elevated blood-pressure reading, without diagnosis of hypertension: Secondary | ICD-10-CM

## 2022-02-15 DIAGNOSIS — Z6841 Body Mass Index (BMI) 40.0 and over, adult: Secondary | ICD-10-CM | POA: Insufficient documentation

## 2022-02-15 DIAGNOSIS — Z8679 Personal history of other diseases of the circulatory system: Secondary | ICD-10-CM

## 2022-02-15 DIAGNOSIS — Z7189 Other specified counseling: Secondary | ICD-10-CM

## 2022-02-15 NOTE — Progress Notes (Incomplete)
Cardiology Office Note:    Date:  02/15/2022   ID:  Phyllis Gentry, DOB 1986-02-02, MRN [id number]  PCP:  Phyllis Freshwater, NP  Cardiologist:  Phyllis Dresser, MD  Referring MD: Phyllis Freshwater, NP   No chief complaint on file.  History of Present Illness:    Phyllis Gentry is a 36 y.o. female with a hx of atrial fibrillation, lymphedema, hypothyroidism, psoriatic arthritis, asthma, and is s/p cholecystectomy, who is seen as a new consult at the request of Phyllis Freshwater, NP for the evaluation and management of atrial fibrillation.  Notes from 01/25/22 visit with Phyllis Pol, NP reviewed. Phyllis Gentry denied any recurrent episodes of atrial fibrillation since her initial event. Not taking any medication for Afib. She was referred to cardiology for surveillance and management of atrial fibrillation and lymphedema (L>R).  Cardiovascular risk factors: Prior clinical ASCVD: Atrial fibrillation diagnosed in 2018 with concurrent thyroid disease. She was on anticoagulation for a short time but developed adverse side effects. Sometimes she feels like she may be in an episode of Afib, but has been unable to capture this on monitors. She also owns an Apple watch. Comorbid conditions: Lymphedema, worsened 2 years ago. Has improved in the last couple months. Uses a pump at home occasionally. She endorses prediabetes which also runs in her family. In the mornings her sugars are often in the 110s. Metabolic syndrome/Obesity: Chronic inflammatory conditions: Psoriatic arthritis diagnosis in 2020, she is not completely certain of this. Tobacco use history: None. Family history: Her mother has hashimoto's thyroiditis. Her father died very young with blood clots. 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 She endorses having this for years, stable. Calcium score of 0. Exercise  level: Typically sedentary. Walks to get the mail, helps drive her mother to places. Current diet: Returning to a more regular diet with protein and fresh fruit. She states she recently had a "garbage" diet during their move from [place].  Lately, she has noticed that her blood pressure has been higher than her baseline (highest 160/96 during a panic attack). It is 140/82 in clinic today. She has never struggled with low blood pressures.  Also, at times she becomes really lightheaded after standing up or beginning activity.  She has occasional night sweats, a relatively new symptom.  She notes having a lot of anxiety and fatigue. She denies any chest pain, or shortness of breath. No headaches, syncope, orthopnea, or PND.  History reviewed. No pertinent past medical history.  History reviewed. No pertinent surgical history.  Current Medications: Current Outpatient Medications on File Prior to Visit  Medication Sig   Cholecalciferol (VITAMIN D) 125 MCG (5000 UT) CAPS    dicyclomine (BENTYL) 20 MG tablet Take 1 tablet (20 mg total) by mouth every 6 (six) hours.   escitalopram (LEXAPRO) 10 MG tablet Take 10 mg by mouth daily.   famotidine (PEPCID) 10 MG tablet    fexofenadine (ALLEGRA) 180 MG tablet    Omega-3 Fatty Acids (FISH OIL) 1000 MG CAPS    propranolol (INDERAL) 40 MG tablet propranolol 40 mg tablet  Take 1 tablet twice a day by oral route.   vitamin B-12 (CYANOCOBALAMIN) 100 MCG tablet    No current facility-administered medications on file prior to visit.     Allergies:   Aminophylline, Cefaclor, Cephalosporins, Sulfa antibiotics, Clarithromycin, and Iodinated contrast media   Social History  Tobacco Use   Smoking status: Never   Smokeless tobacco: Never  Vaping Use   Vaping Use: Never used  Substance Use Topics   Alcohol use: Not Currently   Drug use: Not Currently    Family History: family history is not on file.  ROS:   Please see the history of present  illness.  Additional pertinent ROS: Constitutional: Negative for chills, fever, unintentional weight loss. Positive for fatigue, night sweats. HENT: Negative for ear pain and hearing loss.   Eyes: Negative for loss of vision and eye pain.  Respiratory: Negative for cough, sputum, wheezing.   Cardiovascular: See HPI. Gastrointestinal: Negative for abdominal pain, melena, and hematochezia.  Genitourinary: Negative for dysuria and hematuria.  Musculoskeletal: Negative for falls and myalgias.  Skin: Negative for itching and rash.  Neurological: Negative for focal weakness, focal sensory changes and loss of consciousness. Positive for anxiety, lightheadedness. Endo/Heme/Allergies: Does not bleed easily. Positive for bruises easily.   EKGs/Labs/Other Studies Reviewed:    The following studies were reviewed today:  Care Everywhere: Multiple cardiac testing from [facility] of [place] reviewed.  Echo 06/11/2021 [facility] Services): Summary:   1. Left ventricle: The cavity size is normal. Normal thickness is present.     There are no regional wall motion abnormalities present. The left     ventricular ejection fraction is normal. The LVEF was determined by     visual estimate. Left ventricular ejection fraction is 55-60%. The left     ventricular filling pattern is normal. There is no evidence of diastolic     dysfunction present.  2. Right ventricle: The cavity size is normal. Right ventricular systolic     function is normal.  3. Mitral valve: The leaflets are mildly thickened. There is mild (1+)     mitral valve regurgitation.  4. Aortic valve: The valve is trileaflet. There is no aortic stenosis. There     is no valvular aortic regurgitation.  5. Tricuspid valve: The valve is structurally normal. The tricuspid leaflets     are not thickened. There is mild (1+) tricuspid valve regurgitation.  6. Pericardium, extracardiac: There is no pericardial effusion.    Echo 07/10/2020 [facility] Services): Summary:   1. Left ventricle: The cavity size is normal. Normal thickness is present. There are no regional wall motion abnormalities     present. The left ventricular ejection fraction is normal. Left ventricular ejection fraction was 60-65%. The left ventricular     filling pattern is normal. There is no evidence of diastolic dysfunction present.  2. Right ventricle: The cavity size is normal. Right ventricular systolic function is normal.  3. Mitral valve: The leaflets are normal thickness. There is trace mitral valve regurgitation.  4. Aortic valve: The valve is trileaflet. The leaflets are normal thickness. There is no aortic stenosis. There is trace valvular     aortic regurgitation.  5. Tricuspid valve: The valve is structurally normal. The tricuspid leaflets are not thickened. There is trace tricuspid valve     regurgitation.  6. Pericardium, extracardiac: There is no pericardial effusion.   EKG:  EKG is personally reviewed.   02/15/2022: ***  Recent Labs: 01/28/2022: ALT 9; BUN 12; Creatinine, Ser 0.69; Hemoglobin 12.9; Platelets 446; Potassium 4.6; Sodium 137; TSH 1.760   Recent Lipid Panel    Component Value Date/Time   CHOL 220 (H) 01/28/2022 0910   TRIG 134 01/28/2022 0910   HDL 43 01/28/2022 0910   LDLCALC 153 (H) 01/28/2022  0910    Physical Exam:    VS:  BP 140/82 (BP Location: Right Arm, Patient Position: Sitting, Cuff Size: Large)    Pulse 85    Ht 5\' 4"  (1.626 m)    Wt 295 lb 3.2 oz (133.9 kg)    SpO2 98%    BMI 50.67 kg/m     Wt Readings from Last 3 Encounters:  02/15/22 295 lb 3.2 oz (133.9 kg)  01/25/22 294 lb (133.4 kg)    GEN: Well nourished, well developed in no acute distress HEENT: Normal, moist mucous membranes NECK: No JVD CARDIAC: regular rhythm, normal S1 and S2, no rubs or gallops. No murmur. VASCULAR: Radial and DP pulses 2+ bilaterally. No carotid bruits RESPIRATORY:  Clear to auscultation without  rales, wheezing or rhonchi  ABDOMEN: Soft, non-tender, non-distended MUSCULOSKELETAL:  Ambulates independently SKIN: Warm and dry, ***no edema NEUROLOGIC:  Alert and oriented x 3. No focal neuro deficits noted. PSYCHIATRIC:  Normal affect    ASSESSMENT:    1. Personal history of atrial fibrillation   2. Lymphedema   3. Class 3 severe obesity due to excess calories without serious comorbidity with body mass index (BMI) of 50.0 to 59.9 in adult (HCC)   4. Elevated blood pressure reading   5. Cardiac risk counseling   6. Counseling on health promotion and disease prevention   7. Metabolic syndrome    PLAN:     Cardiac risk counseling and prevention recommendations: -recommend heart healthy/Mediterranean diet, with whole grains, fruits, vegetable, fish, lean meats, nuts, and olive oil. Limit salt. -recommend moderate walking, 3-5 times/week for 30-50 minutes each session. Aim for at least 150 minutes.week. Goal should be pace of 3 miles/hours, or walking 1.5 miles in 30 minutes -recommend avoidance of tobacco products. Avoid excess alcohol. -ASCVD risk score: The ASCVD Risk score (Arnett DK, et al., 2019) failed to calculate for the following reasons:   The 2019 ASCVD risk score is only valid for ages 57 to 48    Plan for follow up: 1 year or sooner as needed.  Phyllis Dresser, MD, PhD, [facility]    Medication Adjustments/Labs and Tests Ordered: Current medicines are reviewed at length with the patient today.  Concerns regarding medicines are outlined above.   No orders of the defined types were placed in this encounter.  No orders of the defined types were placed in this encounter.  There are no Patient Instructions on file for this visit.   I,Mathew Stumpf,acting as a Education administrator for [employer], MD.,have documented all relevant documentation on the behalf of Phyllis Dresser, MD,as directed by  Phyllis Dresser, MD while in the  presence of Phyllis Dresser, MD.  ***  Signed, Phyllis Dresser, MD PhD 02/15/2022 3:48 PM    [place]

## 2022-02-15 NOTE — Patient Instructions (Signed)
Medication Instructions:  ?Your Physician recommend you continue on your current medication as directed.   ? ?*If you need a refill on your cardiac medications before your next appointment, please call your pharmacy* ? ?Follow-Up: ?At [facility], you and your health needs are our priority.  As part of our continuing mission to provide you with exceptional heart care, we have created designated Provider Care Teams.  These Care Teams include your primary Cardiologist (physician) and Advanced Practice Providers (APPs -  Physician Assistants and Nurse Practitioners) who all work together to provide you with the care you need, when you need it. ? ?We recommend signing up for the patient portal called "MyChart".  Sign up information is provided on this After Visit Summary.  MyChart is used to connect with patients for Virtual Visits (Telemedicine).  Patients are able to view lab/test results, encounter notes, upcoming appointments, etc.  Non-urgent messages can be sent to your provider as well.   ?To learn more about what you can do with MyChart, go to [email or web address].   ? ?Your next appointment:   ?1 year(s) ? ?The format for your next appointment:   ?In Person ? ?Provider:   ?Bridgette Christopher, MD{ ? ?

## 2022-02-22 ENCOUNTER — Encounter: Payer: Self-pay | Admitting: Nurse Practitioner

## 2022-02-22 ENCOUNTER — Ambulatory Visit (INDEPENDENT_AMBULATORY_CARE_PROVIDER_SITE_OTHER): Payer: 59 | Admitting: Nurse Practitioner

## 2022-02-22 VITALS — BP 107/75 | HR 84 | Temp 98.0°F | Ht 64.0 in | Wt 297.1 lb

## 2022-02-22 DIAGNOSIS — Z6841 Body Mass Index (BMI) 40.0 and over, adult: Secondary | ICD-10-CM

## 2022-02-22 DIAGNOSIS — N926 Irregular menstruation, unspecified: Secondary | ICD-10-CM

## 2022-02-22 DIAGNOSIS — D529 Folate deficiency anemia, unspecified: Secondary | ICD-10-CM

## 2022-02-22 DIAGNOSIS — F3341 Major depressive disorder, recurrent, in partial remission: Secondary | ICD-10-CM

## 2022-02-22 DIAGNOSIS — E282 Polycystic ovarian syndrome: Secondary | ICD-10-CM

## 2022-02-22 MED ORDER — FOLIC ACID 1 MG PO TABS
1.0000 mg | ORAL_TABLET | Freq: Every day | ORAL | 1 refills | Status: DC
Start: 2022-02-22 — End: 2022-09-02

## 2022-02-22 MED ORDER — ESCITALOPRAM OXALATE 10 MG PO TABS: 10.0000 mg | ORAL_TABLET | Freq: Every day | ORAL | 1 refills | Status: DC

## 2022-02-22 MED ORDER — MEDROXYPROGESTERONE ACETATE 10 MG PO TABS: 10.0000 mg | ORAL_TABLET | Freq: Every day | ORAL | 2 refills | Status: DC

## 2022-02-22 NOTE — Progress Notes (Signed)
Established patient visit ? ? ?Patient: Phyllis Gentry   DOB: [birth year]   36 y.o. Female  MRN: [id number] ?Visit Date: 02/22/2022 ? ? ?Chief Complaint  ?Patient presents with  ? Follow-up  ? ?Subjective  ?  ?HPI  ?The patient is here for routine follow up. She does have history of PCOS. Does need new referral to GYN. Has had to take progesterone for 10 days In the past to jumpstart period. Has been six months since her last menstrual period.  ?-review of labs since most recent visit.  ? -elevation of LDL and total cholesterol  ? -mild elevation of glucose with HgbA1c at 6.0  ? -normal connective tissue panel.  ?No new concerns or complaints today.  ? ? ?Medications: ?Outpatient Medications Prior to Visit  ?Medication Sig  ? albuterol (VENTOLIN HFA) 108 (90 Base) MCG/ACT inhaler Inhale into the lungs.  ? Cholecalciferol (VITAMIN D) 125 MCG (5000 UT) CAPS   ? Cholecalciferol 25 MCG (1000 UT) capsule Take by mouth.  ? Cyanocobalamin 1000 MCG SUBL PLACE 1 TABLET(S) EVERY DAY BY SUBLINGUAL ROUTE.  ? dicyclomine (BENTYL) 20 MG tablet Take 1 tablet (20 mg total) by mouth every 6 (six) hours.  ? famotidine (PEPCID) 10 MG tablet   ? fexofenadine (ALLEGRA) 180 MG tablet   ? Omega-3 Fatty Acids (FISH OIL) 1000 MG CAPS   ? propranolol (INDERAL) 40 MG tablet propranolol 40 mg tablet ? Take 1 tablet twice a day by oral route.  ? vitamin B-12 (CYANOCOBALAMIN) 100 MCG tablet   ? [DISCONTINUED] escitalopram (LEXAPRO) 10 MG tablet Take 10 mg by mouth daily.  ? ?No facility-administered medications prior to visit.  ? ? ?Review of Systems  ?Constitutional:  Positive for fatigue. Negative for activity change, appetite change, chills and fever.  ?HENT:  Negative for congestion, postnasal drip, rhinorrhea, sinus pressure, sinus pain, sneezing and sore throat.   ?Eyes: Negative.   ?Respiratory:  Negative for cough, chest tightness, shortness of breath and wheezing.   ?Cardiovascular:  Negative for chest pain and palpitations.   ?Gastrointestinal:  Positive for diarrhea. Negative for abdominal pain, constipation, nausea and vomiting.  ?Endocrine: Negative for cold intolerance, heat intolerance, polydipsia and polyuria.  ?Genitourinary:  Positive for menstrual problem. Negative for dyspareunia, dysuria, flank pain, frequency and urgency.  ?     History of PCOS. Has not had menstrual cycle in nearly 6 months.   ?Musculoskeletal:  Negative for arthralgias, back pain and myalgias.  ?Skin:  Negative for rash.  ?Allergic/Immunologic: Positive for environmental allergies.  ?Neurological:  Negative for dizziness, weakness and headaches.  ?Hematological:  Negative for adenopathy.  ?Psychiatric/Behavioral:  Positive for dysphoric mood. The patient is nervous/anxious.   ? ?Last CBC ?Lab Results  ?Component Value Date  ? WBC 9.2 01/28/2022  ? HGB 12.9 01/28/2022  ? HCT 42.2 01/28/2022  ? MCV 88 01/28/2022  ? MCH 27.0 01/28/2022  ? RDW 14.7 01/28/2022  ? PLT 446 01/28/2022  ? ?Last metabolic panel ?Lab Results  ?Component Value Date  ? GLUCOSE 104 (H) 01/28/2022  ? NA 137 01/28/2022  ? K 4.6 01/28/2022  ? CL 99 01/28/2022  ? CO2 22 01/28/2022  ? BUN 12 01/28/2022  ? CREATININE 0.69 01/28/2022  ? EGFR 115 01/28/2022  ? CALCIUM 9.3 01/28/2022  ? PROT 7.2 01/28/2022  ? ALBUMIN 3.9 01/28/2022  ? LABGLOB 3.3 01/28/2022  ? AGRATIO 1.2 01/28/2022  ? BILITOT 0.2 01/28/2022  ? ALKPHOS 93 01/28/2022  ? AST 12 01/28/2022  ?  ALT 9 01/28/2022  ? ?Last lipids ?Lab Results  ?Component Value Date  ? CHOL 220 (H) 01/28/2022  ? HDL 43 01/28/2022  ? LDLCALC 153 (H) 01/28/2022  ? TRIG 134 01/28/2022  ? ?Last hemoglobin A1c ?Lab Results  ?Component Value Date  ? HGBA1C 6.0 (H) 01/28/2022  ? ?Last thyroid functions ?Lab Results  ?Component Value Date  ? TSH 1.760 01/28/2022  ? ?Last vitamin D ?Lab Results  ?Component Value Date  ? VD25OH 42.2 01/28/2022  ? ?Last vitamin B12 and Folate ?Lab Results  ?Component Value Date  ? [id number] 1,134 01/28/2022  ? ?  ? ? Objective  ?   ? ?Today's Vitals  ? 02/22/22 1016  ?BP: 107/75  ?Pulse: 84  ?Temp: 98 ?F (36.7 ?C)  ?SpO2: 96%  ?Weight: 297 lb 1.6 oz (134.8 kg)  ?Height: $RemoveB'5\' 4"'OoVwpNks$  (1.626 m)  ? ?Body mass index is 51 kg/m?.  ? ?BP Readings from Last 3 Encounters:  ?03/01/22 126/82  ?02/22/22 107/75  ?02/15/22 140/82  ?  ?Wt Readings from Last 3 Encounters:  ?03/01/22 298 lb (135.2 kg)  ?02/22/22 297 lb 1.6 oz (134.8 kg)  ?02/15/22 295 lb 3.2 oz (133.9 kg)  ?  ?Physical Exam ?Vitals and nursing note reviewed.  ?Constitutional:   ?   Appearance: Normal appearance. She is well-developed. She is obese.  ?HENT:  ?   Head: Normocephalic and atraumatic.  ?   Nose: Nose normal.  ?   Mouth/Throat:  ?   Mouth: Mucous membranes are moist.  ?   Pharynx: Oropharynx is clear.  ?Eyes:  ?   Extraocular Movements: Extraocular movements intact.  ?   Conjunctiva/sclera: Conjunctivae normal.  ?   Pupils: Pupils are equal, round, and reactive to light.  ?Cardiovascular:  ?   Rate and Rhythm: Normal rate and regular rhythm.  ?   Pulses: Normal pulses.  ?   Heart sounds: Normal heart sounds.  ?Pulmonary:  ?   Effort: Pulmonary effort is normal.  ?   Breath sounds: Normal breath sounds.  ?Abdominal:  ?   Palpations: Abdomen is soft.  ?Musculoskeletal:     ?   General: Normal range of motion.  ?   Cervical back: Normal range of motion and neck supple.  ?Lymphadenopathy:  ?   Cervical: No cervical adenopathy.  ?Skin: ?   General: Skin is warm and dry.  ?   Capillary Refill: Capillary refill takes less than 2 seconds.  ?Neurological:  ?   General: No focal deficit present.  ?   Mental Status: She is alert and oriented to person, place, and time.  ?Psychiatric:     ?   Mood and Affect: Mood normal.     ?   Behavior: Behavior normal.     ?   Thought Content: Thought content normal.     ?   Judgment: Judgment normal.  ?  ? ? Assessment & Plan  ?  ?1. Polycystic ovary disease ?Patient may take progesterone $RemoveBeforeDEI'10mg'dTAgtpIkgfRxvFVa$  daily for ten days. Expectation is that cycle will start after  completion of therapy. Refer to GYN provider for further evaluation.  ?- Ambulatory referral to Gynecology ?- medroxyPROGESTERone (PROVERA) 10 MG tablet; Take 1 tablet (10 mg total) by mouth daily.  Dispense: 10 tablet; Refill: 2 ? ?2. Menstrual periods irregular ?Refer to GYN provider for further evaluation.  ?- Ambulatory referral to Gynecology ?- medroxyPROGESTERone (PROVERA) 10 MG tablet; Take 1 tablet (10 mg total) by mouth daily.  Dispense: 10  tablet; Refill: 2 ? ?3. Anemia due to folic acid deficiency, unspecified deficiency type ?Continue folic acid 1 mg daily - refill provided today ?- folic acid (FOLVITE) 1 MG tablet; Take 1 tablet (1 mg total) by mouth daily.  Dispense: 90 tablet; Refill: 1 ? ?4. Recurrent major depressive disorder, in partial remission ([place]) ?Stable. Continue lexapro $RemoveBeforeDE'10mg'CvjsDRVzjSuWPKx$  daily. Refill provided  ?- escitalopram (LEXAPRO) 10 MG tablet; Take 1 tablet (10 mg total) by mouth daily.  Dispense: 90 tablet; Refill: 1 ? ?5. Body mass index (BMI) of 50-59.9 in adult [facility]) ?Discussed lowering calorie intake to 1500 calories per day and incorporating exercise into daily routine to help lose weight.  ? ?Problem List Items Addressed This Visit   ? ?  ? Endocrine  ? Polycystic ovary disease - Primary  ? Relevant Medications  ? medroxyPROGESTERone (PROVERA) 10 MG tablet  ? Other Relevant Orders  ? Ambulatory referral to Gynecology  ?  ? Other  ? Body mass index (BMI) of 50-59.9 in adult [facility])  ? Menstrual periods irregular  ? Relevant Medications  ? medroxyPROGESTERone (PROVERA) 10 MG tablet  ? Other Relevant Orders  ? Ambulatory referral to Gynecology  ? Anemia due to folic acid deficiency  ? Relevant Medications  ? Cyanocobalamin 1000 MCG SUBL  ? folic acid (FOLVITE) 1 MG tablet  ? Recurrent major depressive disorder, in partial remission ([place])  ? Relevant Medications  ? escitalopram (LEXAPRO) 10 MG tablet  ?  ? ?Return in about 6 months (around 08/25/2022) for mood.  ?   ? ? ? ? ?Ronnell Freshwater, NP   ?[facility] ?[phone] (phone) ?[phone] (fax) ? ?[facility]  ?

## 2022-02-28 ENCOUNTER — Encounter (HOSPITAL_BASED_OUTPATIENT_CLINIC_OR_DEPARTMENT_OTHER): Payer: Self-pay | Admitting: Physical Therapy

## 2022-02-28 ENCOUNTER — Ambulatory Visit (HOSPITAL_BASED_OUTPATIENT_CLINIC_OR_DEPARTMENT_OTHER): Payer: 59 | Attending: Orthopaedic Surgery | Admitting: Physical Therapy

## 2022-02-28 ENCOUNTER — Other Ambulatory Visit: Payer: Self-pay

## 2022-02-28 DIAGNOSIS — M6281 Muscle weakness (generalized): Secondary | ICD-10-CM | POA: Insufficient documentation

## 2022-02-28 DIAGNOSIS — M222X1 Patellofemoral disorders, right knee: Secondary | ICD-10-CM | POA: Insufficient documentation

## 2022-02-28 DIAGNOSIS — R262 Difficulty in walking, not elsewhere classified: Secondary | ICD-10-CM | POA: Insufficient documentation

## 2022-02-28 DIAGNOSIS — M25561 Pain in right knee: Secondary | ICD-10-CM | POA: Insufficient documentation

## 2022-02-28 NOTE — Therapy (Signed)
OUTPATIENT PHYSICAL THERAPY LOWER EXTREMITY EVALUATION   Patient Name: Phyllis Gentry MRN: [id number] DOB:December 28, 1985, 36 y.o., female Today's Date: 02/28/2022   PT End of Session - 02/28/22 1351     Visit Number 1    Number of Visits 15    Date for PT Re-Evaluation 05/29/22    Authorization Type [employer]    PT Start Time 1346    PT Stop Time 1430    PT Time Calculation (min) 44 min    Activity Tolerance Patient tolerated treatment well    Behavior During Therapy [facility] for tasks assessed/performed             History reviewed. No pertinent past medical history. History reviewed. No pertinent surgical history. Patient Active Problem List   Diagnosis Date Noted   Class 3 severe obesity due to excess calories without serious comorbidity with body mass index (BMI) of 50.0 to 59.9 in adult [facility]) 02/15/2022   Irritable bowel syndrome with diarrhea 01/27/2022   H/O atrial fibrillation without current medication 01/27/2022   Lymphedema 01/27/2022   Polycystic ovary disease 01/27/2022   Body mass index (BMI) of 50-59.9 in adult [facility]) 01/27/2022   Fibromyalgia 01/25/2022   Hypertension 01/25/2022   Atrial fibrillation (HCC) 07/07/2021   Anxiety 07/07/2021   Asthma 07/07/2021   Psoriatic arthritis (HCC) 02/10/2021    PCP: Carlean Jews, NP  REFERRING PROVIDER: Huel Cote, MD  REFERRING DIAG: M22.2X1 (ICD-10-CM) - Patellofemoral pain syndrome of right knee  THERAPY DIAG:  Pain in joint of right knee - Plan: PT plan of care cert/re-cert  Difficulty walking - Plan: PT plan of care cert/re-cert  Muscle weakness (generalized) - Plan: PT plan of care cert/re-cert  ONSET DATE: 10/2021  SUBJECTIVE:   SUBJECTIVE STATEMENT: Pt states that the R knee pain has been going on since November. Pt denies any specific MOI. Pt states she moved here in November and getting out of low bed possibly caused pain.  Pt states that knee will occasionally buckle. Pt denies  feeling of locking/catching. Pt will occasionally be behind the knee. She states she has stumbled bc of the knee.  Pt  states that the pain really has been at baseline since she was 14. Pt states she feels like the knee dislocated as a child. She states she uses a sleeve on occasion. Pt states that is sometimes to tender to touch. "It just feels weak." Pt has not done any specific exercises. Pt states L leg is dominant, but would kick R leg. Pt denies cancer red flags and systemic symptoms.   PERTINENT HISTORY: Obesity, anxiety, psoriatic arthritis   PAIN:  Are you having pain? No NPRS scale: 0/10, Worst 10/10 Pain location: R, under knee cap Pain orientation: Right  Pain description:  constant, sharp, shooting   Aggravating factors: moving, "motion," walking, stairs, bending/squatting, sitting cross legged Relieving factors: ice/heat, CBD cream  PRECAUTIONS: None  WEIGHT BEARING RESTRICTIONS No  FALLS:  Has patient fallen in last 6 months? 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 To be able to squat, to be able to sit with legs crossed.    OBJECTIVE:   DIAGNOSTIC FINDINGS: IMPRESSION: 1. Borderline extrusion of the medial meniscus body. No meniscal tear.  2. Mild tricompartmental degenerative changes. 3. Small joint effusion.  PATIENT SURVEYS:  FOTO 38 59 at D/C 19pts MCII  COGNITION:  Overall cognitive status: Within functional limits for tasks assessed     SENSATION:  Light touch: Appears intact   MUSCLE LENGTH: WFL for bilat HS and quad- ROM limited due to body habitus  POSTURE:  Toe out, mild genu valgus, knees rest in hyperext  PALPATION: TTP along medial joint line, inf patellar pole,  and patellar tendon  LE AROM/PROM:  A/PROM Right 02/28/2022 Left 02/28/2022  Knee flexion 100 115  Knee extension 5 5   (Blank rows = not tested)  LE MMT: measured in lbs   MMT Right 02/28/2022 Left 02/28/2022  Hip abduction 49.2 50.7  Knee flexion 25.7 52.7  Knee extension 30.2 55   (Blank rows = not tested)  LOWER EXTREMITY SPECIAL TESTS:  Knee special tests: Anterior drawer test: negative, McMurray's test: positive , Thessaly test: positive , Patellafemoral apprehension test: negative, and Step up/down test: positive    GAIT: Distance walked: 27ft Assistive device utilized: None Level of assistance: Complete Independence Comments: mildly antalgic, decreased R knee flexion, decreased foot clearance  Stairs: step to pattern ascend and descend with R UE support, decreased eccentric lower strength on R, pain with descend    TODAY'S TREATMENT: Exercises Wall Sit - 1 x daily - 7 x weekly - 1 sets - 5 reps - 10 hold Side Stepping with Resistance at Thighs - 1 x daily - 7 x weekly - 2 sets - 1 reps - 59ft hold Seated Long Arc Quad - 1 x daily - 7 x weekly - 3 sets - 10 reps   PATIENT EDUCATION:  Education details: MOI, diagnosis, prognosis, anatomy, exercise progression, DOMS expectations, muscle firing,  envelope of function, HEP, POC  Person educated: Patient Education method: Explanation, Demonstration, Tactile cues, Verbal cues, and Handouts Education comprehension: verbalized understanding, returned demonstration, verbal cues required, and tactile cues required   HOME EXERCISE PROGRAM: Access Code: [facility] URL: [email or web address] Date: 02/28/2022 Prepared by: Zebedee Iba     ASSESSMENT:  CLINICAL IMPRESSION: Patient is a 36 y.o. female who was seen today for physical therapy evaluation and treatment for cc of R knee PFPS. Pt's s/s appear consistent with PFPS/anterior knee pain.  Pt demonstrates expected quadriceps and hip weakness that are  contributing to knee pain. Pt's pain is moderately sensitive and irritable with movement. Pt's largest deficit is strength. MOI likely concurrent with report of increased, repetitive deep knee flexion squatting. Pt would benefit from continued skilled therapy in order to reach goals and maximize functional R knee strength and ROM for prevention of further functional decline.   OBJECTIVE IMPAIRMENTS Abnormal gait, decreased activity tolerance, decreased mobility, difficulty walking, decreased ROM, decreased strength, hypomobility, increased muscle spasms, impaired flexibility, improper body mechanics, postural dysfunction, obesity, and pain.   ACTIVITY LIMITATIONS cleaning, community activity, driving, occupation, Pharmacologist, yard work, shopping, and Interior and spatial designer .   PERSONAL FACTORS Age, Behavior pattern, Fitness, Past/current experiences, Time since onset of injury/illness/exacerbation, and 1-2 comorbidities:    are also affecting patient's functional outcome.    REHAB POTENTIAL: Good  CLINICAL DECISION MAKING: Stable/uncomplicated  EVALUATION COMPLEXITY: Low   GOALS:   SHORT TERM GOALS:  Pt will become independent with HEP in order to demonstrate synthesis of PT education.   Target date: 04/11/2022 Goal status: INITIAL  2.  Pt will be able to demonstrate reciprocal ascend and descend on stairs with single UE support in order  to demonstrate functional improvement in LE function for self-care and house hold duties.   Target date: 04/11/2022 Goal status: INITIAL  3.  Pt will score at least 19 pt increase on FOTO to demonstrate functional improvement in MCII and pt perceived function.   Target date: 04/11/2022 Goal status: INITIAL   LONG TERM GOALS:  Pt  will become independent with final HEP in order to demonstrate synthesis of PT education.   Target date: 05/23/2022 Goal status: INITIAL  2.  Pt will score >/= 59 on FOTO to demonstrate improvement in perceived R knee  function.   Target date: 05/23/2022 Goal status: INITIAL  3.  Pt will be able to demonstrate ability sit with legs crossed without pain in order to demonstrate functional improvement in LE function for self-care and dressing/bathing.   Target date: 05/23/2022 Goal status: INITIAL  4.  Pt will be able to demonstrate ability to perform DL squat below parallel without pain in order to demonstrate functional improvement and tolerance to transfers, ADL, and daily house duties.  Target date: 05/23/2022 Goal status: INITIAL   PLAN: PT FREQUENCY: 1-2x/week  PT DURATION: 12 weeks (likely D/C by 8 wks)  PLANNED INTERVENTIONS: Therapeutic exercises, Therapeutic activity, Neuromuscular re-education, Balance training, Gait training, Patient/Family education, Joint manipulation, Joint mobilization, Stair training, Aquatic Therapy, Dry Needling, Electrical stimulation, Spinal manipulation, Spinal mobilization, Cryotherapy, Moist heat, scar mobilization, Taping, Vasopneumatic device, Traction, Ultrasound, Ionotophoresis 4mg /ml Dexamethasone, and Manual therapy  PLAN FOR NEXT SESSION: review HEP, trial K-taping, SLR, endurance quad holds, bridge with band   Zebedee IbaAlan Wadell Craddock, PT 02/28/2022, 3:58 PM

## 2022-03-01 ENCOUNTER — Ambulatory Visit (INDEPENDENT_AMBULATORY_CARE_PROVIDER_SITE_OTHER): Payer: 59 | Admitting: Radiology

## 2022-03-01 ENCOUNTER — Encounter: Payer: Self-pay | Admitting: Radiology

## 2022-03-01 ENCOUNTER — Other Ambulatory Visit (HOSPITAL_COMMUNITY)
Admission: RE | Admit: 2022-03-01 | Discharge: 2022-03-01 | Disposition: A | Discharge status: Home / Self Care | Payer: 59 | Source: Ambulatory Visit | Attending: Radiology | Admitting: Radiology

## 2022-03-01 VITALS — BP 126/82 | Ht 64.0 in | Wt 298.0 lb

## 2022-03-01 DIAGNOSIS — Z01419 Encounter for gynecological examination (general) (routine) without abnormal findings: Secondary | ICD-10-CM

## 2022-03-01 DIAGNOSIS — E282 Polycystic ovarian syndrome: Secondary | ICD-10-CM | POA: Diagnosis not present

## 2022-03-01 DIAGNOSIS — R7303 Prediabetes: Secondary | ICD-10-CM

## 2022-03-01 MED ORDER — METFORMIN HCL 500 MG PO TABS: 500.0000 mg | ORAL_TABLET | Freq: Two times a day (BID) | ORAL | 0 refills | Status: DC

## 2022-03-01 NOTE — Progress Notes (Signed)
? ?  Phyllis Gentry 21-Oct-1986 [id number] ? ? ?History:  36 y.o. G0 presents for annual exam. 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 Previous diagnosis of PCOS, using provera prn to induce a withdrawal bleed. OCPs cause heart palpitations, did not do well on those. hgbA1c was checked at PCP and elevated to 6.0. Normal TSH level. ? ?Gynecologic History ?No LMP recorded. ?Period Cycle (Days):  (irregular) ?Period Pattern: (!) Irregular ?Contraception/Family planning:  partner is a transgender man ?Sexually active: yes ?Last Pap: 2018. Results were: normal ? ?Obstetric History ?OB History  ?Gravida Para Term Preterm AB Living  ?0 0 0 0 0 0  ?SAB IAB Ectopic Multiple Live Births  ?0 0 0 0 0  ? ? ? ?The following portions of the patient's history were reviewed and updated as appropriate: allergies, current medications, past family history, past medical history, past social history, past surgical history, and problem list. ? ?Review of Systems ?Pertinent items noted in HPI and remainder of comprehensive ROS otherwise negative.  ? ?Past medical history, past surgical history, family history and social history were all reviewed and documented in the EPIC chart. ? ? ?Exam: ? ?Vitals:  ? 03/01/22 1051  ?BP: 126/82  ?Weight: 298 lb (135.2 kg)  ?Height: 5\' 4"  (1.626 m)  ? ?Body mass index is 51.15 kg/m?. ? ?General appearance:  Normal, NAD, obese ?Thyroid:  Symmetrical, normal in size, without palpable masses or nodularity. ?Respiratory ? Auscultation:  Clear without wheezing or rhonchi ?Cardiovascular ? Auscultation:  Regular rate, without rubs, murmurs or gallops ? Edema/varicosities:  Not grossly evident ?Abdominal ? Soft,nontender, without masses, guarding or rebound. ? Liver/spleen:  No organomegaly noted ? Hernia:  None appreciated ? Skin ? Inspection:  Grossly normal ?Breasts: Examined lying and sitting.  ? Right: Without masses, retractions, nipple discharge or axillary  adenopathy. ? ? Left: Without masses, retractions, nipple discharge or axillary adenopathy. ?Genitourinary  ? Inguinal/mons:  Normal without inguinal adenopathy ? External genitalia:  Normal appearing vulva with no masses, tenderness, or lesions ? BUS/Urethra/Skene's glands:  Normal without masses or exudate ? Vagina:  Normal appearing with normal color and discharge, no lesions ? Cervix:  Normal appearing without discharge or lesions ? Uterus:  Normal in size, shape and contour.  Mobile, nontender ? Adnexa/parametria:   ?  Rt: Normal in size, without masses or tenderness. ?  Lt: Normal in size, without masses or tenderness. ? Anus and perineum: Normal ?  ?Patient informed chaperone available to be present for breast and pelvic exam. Patient has requested no chaperone to be present. Patient has been advised what will be completed during breast and pelvic exam.  ? ?Assessment/Plan:   ? ?1. PCOS (polycystic ovarian syndrome) ?Continue current course of provera to cause withdrawal bleed ? ?2. Well woman exam with routine gynecological exam ? ?- Cytology - PAP( [place]) ? ?3. Prediabetes ?Begin metformin with 1 dose nightly after dinner then increase to BID as tolerated ?- metFORMIN (GLUCOPHAGE) 500 MG tablet; Take 1 tablet (500 mg total) by mouth 2 (two) times daily with a meal.  Dispense: 180 tablet; Refill: 0  ? ? ?Discussed PCOS, SBE, pap screening as directed/appropriate. Recommend of exercise weekly, including weight bearing exercise. Encouraged the use of seatbelts and sunscreen. ?Return in 3 mos for follow up and 1 year for annual or sooner as needed.  ? ? WHNP-BC 11:24 AM 03/01/2022  ?

## 2022-03-03 DIAGNOSIS — N926 Irregular menstruation, unspecified: Secondary | ICD-10-CM | POA: Insufficient documentation

## 2022-03-03 DIAGNOSIS — F3341 Major depressive disorder, recurrent, in partial remission: Secondary | ICD-10-CM | POA: Insufficient documentation

## 2022-03-03 DIAGNOSIS — D529 Folate deficiency anemia, unspecified: Secondary | ICD-10-CM | POA: Insufficient documentation

## 2022-03-03 NOTE — Patient Instructions (Signed)
Fat and Cholesterol Restricted Eating Plan Getting too much fat and cholesterol in your diet may cause health problems. Choosing the right foods helps keep your fat and cholesterol at normal levels. This can keep you from getting certain diseases. Your doctor may recommend an eating plan that includes: Total fat: ______% or less of total calories a day. This is ______g of fat a day. Saturated fat: ______% or less of total calories a day. This is ______g of saturated fat a day. Cholesterol: less than _________mg a day. Fiber: ______g a day. What are tips for following this plan? General tips Work with your doctor to lose weight if you need to. Avoid: Foods with added sugar. Fried foods. Foods with trans fat or partially hydrogenated oils. This includes some margarines and baked goods. If you drink alcohol: Limit how much you have to: 0-1 drink a day for women who are not pregnant. 0-2 drinks a day for men. Know how much alcohol is in a drink. In the [place] one drink equals one 12 oz bottle of beer (355 mL), one 5 oz glass of wine (148 mL), or one 1 oz glass of hard liquor (44 mL). Reading food labels Check food labels for: Trans fats. Partially hydrogenated oils. Saturated fat (g) in each serving. Cholesterol (mg) in each serving. Fiber (g) in each serving. Choose foods with healthy fats, such as: Monounsaturated fats and polyunsaturated fats. These include olive and canola oil, flaxseeds, walnuts, almonds, and seeds. Omega-3 fats. These are found in certain fish, flaxseed oil, and ground flaxseeds. Choose grain products that have whole grains. Look for the word "whole" as the first word in the ingredient list. Cooking Cook foods using low-fat methods. These include baking, boiling, grilling, and broiling. Eat more home-cooked foods. Eat at restaurants and buffets less often. Eat less fast food. Avoid cooking using saturated fats, such as butter, cream, palm oil, palm kernel oil, and  coconut oil. Meal planning  At meals, divide your plate into four equal parts: Fill one-half of your plate with vegetables, green salads, and fruit. Fill one-fourth of your plate with whole grains. Fill one-fourth of your plate with low-fat (lean) protein foods. Eat fish that is high in omega-3 fats at least two times a week. This includes mackerel, tuna, sardines, and salmon. Eat foods that are high in fiber, such as whole grains, beans, apples, pears, berries, broccoli, carrots, peas, and barley. What foods should I eat? Fruits All fresh, canned (in natural juice), or frozen fruits. Vegetables Fresh or frozen vegetables (raw, steamed, roasted, or grilled). Green salads. Grains Whole grains, such as whole wheat or whole grain breads, crackers, cereals, and pasta. Unsweetened oatmeal, bulgur, barley, quinoa, or brown rice. Corn or whole wheat flour tortillas. Meats and other protein foods Ground beef (85% or leaner), grass-fed beef, or beef trimmed of fat. Skinless chicken or turkey. Ground chicken or turkey. Pork trimmed of fat. All fish and seafood. Egg whites. Dried beans, peas, or lentils. Unsalted nuts or seeds. Unsalted canned beans. Nut butters without added sugar or oil. Dairy Low-fat or nonfat dairy products, such as skim or 1% milk, 2% or reduced-fat cheeses, low-fat and fat-free ricotta or cottage cheese, or plain low-fat and nonfat yogurt. Fats and oils Tub margarine without trans fats. Light or reduced-fat mayonnaise and salad dressings. Avocado. Olive, canola, sesame, or safflower oils. The items listed above may not be a complete list of foods and beverages you can eat. Contact a dietitian for more information. What foods   should I avoid? Fruits Canned fruit in heavy syrup. Fruit in cream or butter sauce. Fried fruit. Vegetables Vegetables cooked in cheese, cream, or butter sauce. Fried vegetables. Grains White bread. White pasta. White rice. Cornbread. Bagels, pastries,  and croissants. Crackers and snack foods that contain trans fat and hydrogenated oils. Meats and other protein foods Fatty cuts of meat. Ribs, chicken wings, bacon, sausage, bologna, salami, chitterlings, fatback, hot dogs, bratwurst, and packaged lunch meats. Liver and organ meats. Whole eggs and egg yolks. Chicken and turkey with skin. Fried meat. Dairy Whole or 2% milk, cream, half-and-half, and cream cheese. Whole milk cheeses. Whole-fat or sweetened yogurt. Full-fat cheeses. Nondairy creamers and whipped toppings. Processed cheese, cheese spreads, and cheese curds. Fats and oils Butter, stick margarine, lard, shortening, ghee, or bacon fat. Coconut, palm kernel, and palm oils. Beverages Alcohol. Sugar-sweetened drinks such as sodas, lemonade, and fruit drinks. Sweets and desserts Corn syrup, sugars, honey, and molasses. Candy. Jam and jelly. Syrup. Sweetened cereals. Cookies, pies, cakes, donuts, muffins, and ice cream. The items listed above may not be a complete list of foods and beverages you should avoid. Contact a dietitian for more information. Summary Choosing the right foods helps keep your fat and cholesterol at normal levels. This can keep you from getting certain diseases. At meals, fill one-half of your plate with vegetables, green salads, and fruits. Eat high fiber foods, like whole grains, beans, apples, pears, berries, carrots, peas, and barley. Limit added sugar, saturated fats, alcohol, and fried foods. This information is not intended to replace advice given to you by your health care provider. Make sure you discuss any questions you have with your health care provider. Document Revised: 04/20/2021 Document Reviewed: 04/20/2021 Elsevier Patient Education  2022 Elsevier Inc.  

## 2022-03-05 ENCOUNTER — Encounter (HOSPITAL_BASED_OUTPATIENT_CLINIC_OR_DEPARTMENT_OTHER): Payer: Self-pay | Admitting: Physical Therapy

## 2022-03-05 ENCOUNTER — Other Ambulatory Visit: Payer: Self-pay

## 2022-03-05 ENCOUNTER — Ambulatory Visit (HOSPITAL_BASED_OUTPATIENT_CLINIC_OR_DEPARTMENT_OTHER): Payer: 59 | Admitting: Physical Therapy

## 2022-03-05 DIAGNOSIS — M25561 Pain in right knee: Secondary | ICD-10-CM | POA: Diagnosis not present

## 2022-03-05 DIAGNOSIS — M6281 Muscle weakness (generalized): Secondary | ICD-10-CM

## 2022-03-05 DIAGNOSIS — R262 Difficulty in walking, not elsewhere classified: Secondary | ICD-10-CM

## 2022-03-05 NOTE — Therapy (Signed)
?OUTPATIENT PHYSICAL THERAPY LOWER EXTREMITY EVALUATION ? ? ?Patient Name: Phyllis EvertLaura Marie Gentry ?MRN: [id number] ?DOB:[birth year], 36 y.o., female ?Today's Date: 03/05/2022 ? ? PT End of Session - 03/05/22 0932   ? ? Visit Number 2   ? Number of Visits 15   ? Date for PT Re-Evaluation 05/29/22   ? Authorization Type [facility] [employer]   ? PT Start Time [phone]   ? PT Stop Time 1015   ? PT Time Calculation (min) 48 min   ? ?  ?  ? ?  ? ? ?Past Medical History:  ?Diagnosis Date  ? Hypertension   ? Lymphedema   ? PCOS (polycystic ovarian syndrome)   ? Prediabetes   ? ?Past Surgical History:  ?Procedure Laterality Date  ? CHOLECYSTECTOMY    ? DILATION AND CURETTAGE OF UTERUS    ? ?Patient Active Problem List  ? Diagnosis Date Noted  ? Menstrual periods irregular 03/03/2022  ? 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 Recurrent major depressive disorder, in partial remission (HCC) 03/03/2022  ? Class 3 severe obesity due to excess calories without serious comorbidity with body mass index (BMI) of 50.0 to 59.9 in adult [facility](HCC) 02/15/2022  ? Irritable bowel syndrome with diarrhea 01/27/2022  ? H/O atrial fibrillation without current medication 01/27/2022  ? Lymphedema 01/27/2022  ? Polycystic ovary disease 01/27/2022  ? Body mass index (BMI) of 50-59.9 in adult [facility](HCC) 01/27/2022  ? Fibromyalgia 01/25/2022  ? Hypertension 01/25/2022  ? Atrial fibrillation (HCC) 07/07/2021  ? Anxiety 07/07/2021  ? Asthma 07/07/2021  ? Psoriatic arthritis (HCC) 02/10/2021  ? ? ?PCP: Carlean JewsBoscia, Heather E, NP ? ?REFERRING PROVIDER: Carlean JewsBoscia, Heather E, NP ? ?REFERRING DIAG: M22.2X1 (ICD-10-CM) - Patellofemoral pain syndrome of right knee ? ?THERAPY DIAG:  ?No diagnosis found. ? ?ONSET DATE: 10/2021 ? ?SUBJECTIVE:  ? ?SUBJECTIVE STATEMENT: ?Patient reports she has been doing well with her exercise. She reports the red band is a little small though. She feels like the wall sits are helping.  ? ? ? ? ?Eval: Pt states that the R knee pain has been going on  since November. Pt denies any specific MOI. Pt states she moved here in November and getting out of low bed possibly caused pain.  Pt states that knee will occasionally buckle. Pt denies feeling of locking/catching. Pt will occasionally be behind the knee. She states she has stumbled bc of the knee.  Pt  states that the pain really has been at baseline since she was 14. Pt states she feels like the knee dislocated as a child. She states she uses a sleeve on occasion. Pt states that is sometimes to tender to touch. "It just feels weak." Pt has not done any specific exercises. Pt states L leg is dominant, but would kick R leg. Pt denies cancer red flags and systemic symptoms.  ? ?PERTINENT HISTORY: ?Obesity, anxiety, psoriatic arthritis  ? ?PAIN:  ?Are you having pain? No 3/14 just when she is up on them  ?NPRS scale: 0/10, Worst 10/10 ?Pain location: R, under knee cap ?Pain orientation: Right  ?Pain description:  constant, sharp, shooting   ?Aggravating factors: moving, "motion," walking, stairs, bending/squatting, sitting cross legged ?Relieving factors: ice/heat, CBD cream ? ?PRECAUTIONS: None ? ?WEIGHT BEARING RESTRICTIONS No ? ?FALLS:  ?Has patient fallen in last 6 months? No, Number of falls: 0 ? ?LIVING ENVIRONMENT: ?Lives with: lives with their family ?Lives in: House/apartment ?Stairs: no ?Has following equipment at home: None ? 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 ?PLOF: Independent with basic ADLs ? ?PATIENT GOALS : Pt would like to get legs stronger. To be able to squat, to be able to sit with legs crossed.  ? ? ?OBJECTIVE:  ? ?DIAGNOSTIC FINDINGS: IMPRESSION: ?1. Borderline extrusion of the medial meniscus body. No meniscal ?tear. ?2. Mild tricompartmental degenerative changes. ?3. Small joint effusion. ? ?PATIENT SURVEYS:  ?FOTO 59 ?59 at D/C ?19pts MCII ? ?COGNITION: ? Overall cognitive status: Within functional limits for tasks assessed   ?  ?SENSATION: ? Light touch:  Appears intact ? ? ?MUSCLE LENGTH: ?WFL for bilat HS and quad- ROM limited due to body habitus ? ?POSTURE:  ?Toe out, mild genu valgus, knees rest in hyperext ? ?PALPATION: ?TTP along medial joint line, inf patellar pole, and patellar tendon ? ?LE AROM/PROM: ? ?A/PROM Right ?03/05/2022 Left ?03/05/2022  ?Knee flexion 100 115  ?Knee extension 5 5  ? (Blank rows = not tested) ? ?LE MMT: measured in lbs  ? ?MMT Right ?03/05/2022 Left ?03/05/2022  ?Hip abduction 49.2 50.7  ?Knee flexion 25.7 52.7  ?Knee extension 30.2 55  ? (Blank rows = not tested) ? ?LOWER EXTREMITY SPECIAL TESTS:  ?Knee special tests: Anterior drawer test: negative, McMurray's test: positive , Thessaly test: positive , Patellafemoral apprehension test: negative, and Step up/down test: positive  ? ? ?GAIT: ?Distance walked: 90ft ?Assistive device utilized: None ?Level of assistance: Complete Independence ?Comments: mildly antalgic, decreased R knee flexion, decreased foot clearance ? ?Stairs: step to pattern ascend and descend with R UE support, decreased eccentric lower strength on R, pain with descend ? ? ? ?TODAY'S TREATMENT: ?3/14 ?Nu-step 5 min  ?SLR 3x10  ?Bridge with band 2x10 ?Clamshell with band 3x10  ? ?Quad stretch for home 3x20 sec hold  ?Hamstring stretch seated 3x20 sec hold  ? ?Standing heel raise x20  ?Standing slow march x20  ? ? ? ?Eval ?Exercises ?Wall Sit - 1 x daily - 7 x weekly - 1 sets - 5 reps - 10 hold ?Side Stepping with Resistance at Thighs - 1 x daily - 7 x weekly - 2 sets - 1 reps - 17ft hold ?Seated Long Arc Quad - 1 x daily - 7 x weekly - 3 sets - 10 reps ? ? ?PATIENT EDUCATION:  ?Education details: Reviewed HEP and symptom management  ?envelope of function, HEP, POC  ?Person educated: Patient ?Education method: Explanation, Demonstration, Tactile cues, Verbal cues, and Handouts ?Education comprehension: verbalized understanding, returned demonstration, verbal cues required, and tactile cues required ? ? ?HOME EXERCISE  PROGRAM: ?Access Code: [facility] ?URL: [email or web address] ?Date: 02/28/2022 ?Prepared by: Zebedee Iba ? ? ? ? ?ASSESSMENT: ? ?CLINICAL IMPRESSION: ?The patient tolerated treatment well. She had no significant pain. She found straight leg raise challenging. She was given a bigger band. Therapy updated her HEP. She now has 4 standing and 4 supine exercises. She was advised to do a series of one or the other. She can do more if she wants. She was advised to use her stretches as tolerated. We will continue to advance as tolerated.  ? ?OBJECTIVE IMPAIRMENTS Abnormal gait, decreased activity tolerance, decreased mobility, difficulty walking, decreased ROM, decreased strength, hypomobility, increased muscle spasms, impaired flexibility, improper body mechanics, postural dysfunction, obesity, and pain.  ? ?ACTIVITY LIMITATIONS cleaning, community activity, driving, occupation, Pharmacologist, yard work, shopping, and Interior and spatial designer .  ? ?PERSONAL FACTORS Age, Behavior pattern, Fitness, Past/current experiences, Time since onset of injury/illness/exacerbation, and 1-2 comorbidities:  are also affecting patient's functional outcome.  ? ? ?REHAB POTENTIAL: Good ? ?CLINICAL DECISION MAKING: Stable/uncomplicated ? ?EVALUATION COMPLEXITY: Low ? ? ?GOALS: ? ? ?SHORT TERM GOALS: ? ?Pt will become independent with HEP in order to demonstrate synthesis of PT education. ? ? ?Target date: 04/16/2022 ?Goal status: INITIAL ? ?2.  Pt will be able to demonstrate reciprocal ascend and descend on stairs with single UE support in order to demonstrate functional improvement in LE function for self-care and house hold duties. ? ? ?Target date: 04/16/2022 ?Goal status: INITIAL ? ?3.  Pt will score at least 19 pt increase on FOTO to demonstrate functional improvement in MCII and pt perceived function.   ?Target date: 04/16/2022 ?Goal status: INITIAL ? ? ?LONG TERM GOALS: ? ?Pt  will become independent with final HEP in order to  demonstrate synthesis of PT education. ? ? ?Target date: 05/28/2022 ?Goal status: INITIAL ? ?2.  Pt will score >/= 59 on FOTO to demonstrate improvement in perceived R knee function.  ? ?Target date: 05/28/2022 ?Goal stat

## 2022-03-08 LAB — CYTOLOGY - PAP
Comment: NEGATIVE
Comment: NEGATIVE
Diagnosis: NEGATIVE
Diagnosis: REACTIVE
HPV 16: NEGATIVE
HPV 18 / 45: NEGATIVE
High risk HPV: POSITIVE — AB

## 2022-03-12 ENCOUNTER — Encounter (HOSPITAL_BASED_OUTPATIENT_CLINIC_OR_DEPARTMENT_OTHER): Payer: Self-pay | Admitting: Physical Therapy

## 2022-03-12 ENCOUNTER — Ambulatory Visit (HOSPITAL_BASED_OUTPATIENT_CLINIC_OR_DEPARTMENT_OTHER): Payer: 59 | Admitting: Physical Therapy

## 2022-03-12 ENCOUNTER — Other Ambulatory Visit: Payer: Self-pay

## 2022-03-12 DIAGNOSIS — M25561 Pain in right knee: Secondary | ICD-10-CM | POA: Diagnosis not present

## 2022-03-12 DIAGNOSIS — M6281 Muscle weakness (generalized): Secondary | ICD-10-CM

## 2022-03-12 DIAGNOSIS — R262 Difficulty in walking, not elsewhere classified: Secondary | ICD-10-CM

## 2022-03-12 NOTE — Therapy (Signed)
?OUTPATIENT PHYSICAL THERAPY LOWER EXTREMITY treatment  ? ? ?Patient Name: Phyllis Gentry ?MRN: [id number] ?DOB:12-11-1986, 36 y.o., female ?Today's Date: 03/12/2022 ? ? PT End of Session - 03/12/22 1121   ? ? Visit Number 3   ? Number of Visits 15   ? Date for PT Re-Evaluation 05/29/22   ? Authorization Type [employer]   ? PT Start Time 1111   Patient thought her appointment was at 11:30  ? Activity Tolerance Patient tolerated treatment well   ? Behavior During Therapy [facility] for tasks assessed/performed   ? ?  ?  ? ?  ? ? ?Past Medical History:  ?Diagnosis Date  ? Hypertension   ? Lymphedema   ? PCOS (polycystic ovarian syndrome)   ? Prediabetes   ? ?Past Surgical History:  ?Procedure Laterality Date  ? CHOLECYSTECTOMY    ? DILATION AND CURETTAGE OF UTERUS    ? ?Patient Active Problem List  ? Diagnosis Date Noted  ? Menstrual periods irregular 03/03/2022  ? 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 Recurrent major depressive disorder, in partial remission (HCC) 03/03/2022  ? Class 3 severe obesity due to excess calories without serious comorbidity with body mass index (BMI) of 50.0 to 59.9 in adult [facility]) 02/15/2022  ? Irritable bowel syndrome with diarrhea 01/27/2022  ? H/O atrial fibrillation without current medication 01/27/2022  ? Lymphedema 01/27/2022  ? Polycystic ovary disease 01/27/2022  ? Body mass index (BMI) of 50-59.9 in adult [facility]) 01/27/2022  ? Fibromyalgia 01/25/2022  ? Hypertension 01/25/2022  ? Atrial fibrillation (HCC) 07/07/2021  ? Anxiety 07/07/2021  ? Asthma 07/07/2021  ? Psoriatic arthritis (HCC) 02/10/2021  ? ? ?PCP: Carlean Jews, NP ? ?REFERRING PROVIDER: Carlean Jews, NP ? ?REFERRING DIAG: M22.2X1 (ICD-10-CM) - Patellofemoral pain syndrome of right knee ? ?THERAPY DIAG:  ?No diagnosis found. ? ?ONSET DATE: 10/2021 ? ?SUBJECTIVE:  ? ?SUBJECTIVE STATEMENT: ?The patient reports that her right knee hurt a little more this week. It is feeling better now. She does have some  stiffness and pain in her right hip and lower back  ? ? ? ?Eval: Pt states that the R knee pain has been going on since November. Pt denies any specific MOI. Pt states she moved here in November and getting out of low bed possibly caused pain.  Pt states that knee will occasionally buckle. Pt denies feeling of locking/catching. Pt will occasionally be behind the knee. She states she has stumbled bc of the knee.  Pt  states that the pain really has been at baseline since she was 14. Pt states she feels like the knee dislocated as a child. She states she uses a sleeve on occasion. Pt states that is sometimes to tender to touch. "It just feels weak." Pt has not done any specific exercises. Pt states L leg is dominant, but would kick R leg. Pt denies cancer red flags and systemic symptoms.  ? ?PERTINENT HISTORY: ?Obesity, anxiety, psoriatic arthritis  ? ?PAIN:  ?Are you having pain? No 3/21 just when she is up on them  ?NPRS scale: 2-3/10, Worst 10/10 ?Pain location: Hip and knee  ?Pain orientation: Right  ?Pain description:  constant, sharp, shooting   ?Aggravating factors: moving, "motion," walking, stairs, bending/squatting, sitting cross legged ?Relieving factors: ice/heat, CBD cream ? ?PRECAUTIONS: None ? ?WEIGHT BEARING RESTRICTIONS No ? ?FALLS:  ?Has patient fallen in last 6 months? No, Number of falls: 0 ? ?LIVING ENVIRONMENT: ?Lives with: lives with their family ?  Lives in: House/apartment ?Stairs: no ?Has following equipment at home: None ? 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 ?PLOF: Independent with basic ADLs ? ?PATIENT GOALS : Pt would like to get legs stronger. To be able to squat, to be able to sit with legs crossed.  ? ? ?OBJECTIVE:  ? ? ?GAIT: ?Distance walked: 49ft ?Assistive device utilized: None ?Level of assistance: Complete Independence ?Comments: mildly antalgic, decreased R knee flexion, decreased foot clearance ? ?Stairs: step to pattern ascend and descend with R  UE support, decreased eccentric lower strength on R, pain with descend ? ? ? ?TODAY'S TREATMENT: ?3/21 ? ?Nu-step 5 min  ?SLR 2x10 bilateral  ?Bridge with band 2x10 ?Supine march 2x10  ? ? ?Standing hip abduction x10  ?Standing hip extension x10  ? ? ? ?Row 2x10 with cuing for core  ?Shoulder extension 2x10 with cuing for  ? ?3/14 ?Nu-step 5 min  ?SLR 3x10  ?Bridge with band 2x10 ?Clamshell with band 3x10  ? ?Quad stretch for home 3x20 sec hold  ?Hamstring stretch seated 3x20 sec hold  ? ? ?Standing hip abduction x10 each side  ?Standing hip extension 2x10  ? ? ? ? ?Eval ?Exercises ?Wall Sit - 1 x daily - 7 x weekly - 1 sets - 5 reps - 10 hold ?Side Stepping with Resistance at Thighs - 1 x daily - 7 x weekly - 2 sets - 1 reps - 1ft hold ?Seated Long Arc Quad - 1 x daily - 7 x weekly - 3 sets - 10 reps ? ? ?PATIENT EDUCATION:  ?Education details: Reviewed HEP and symptom management  ?envelope of function, HEP, POC  ?Person educated: Patient ?Education method: Explanation, Demonstration, Tactile cues, Verbal cues, and Handouts ?Education comprehension: verbalized understanding, returned demonstration, verbal cues required, and tactile cues required ? ? ?HOME EXERCISE PROGRAM: ?Access Code: [email or web address] ?URL: [email or web address] ?Date: 02/28/2022 ?Prepared by: Zebedee Iba ? ? ? ? ?ASSESSMENT: ? ?CLINICAL IMPRESSION: ?The patient was unable to do the bike because of her hip and back today. Therapy reviewed light stretching with her for her hip and back. Therapy gave her expanded standing exercises to do at home. We also reviewed some UE core exercises to work on on days that her back and knee are given her trouble. Her back and hip are likely playing a role in her hip pain. During her treatment she felt her knee pop. It caused her a lot of pain. She reports this happens. It was sore after that happened. We will trial taping next visit for patellar stability.  ? ?OBJECTIVE IMPAIRMENTS Abnormal gait, decreased  activity tolerance, decreased mobility, difficulty walking, decreased ROM, decreased strength, hypomobility, increased muscle spasms, impaired flexibility, improper body mechanics, postural dysfunction, obesity, and pain.  ? ?ACTIVITY LIMITATIONS cleaning, community activity, driving, occupation, Pharmacologist, yard work, shopping, and Interior and spatial designer .  ? ?PERSONAL FACTORS Age, Behavior pattern, Fitness, Past/current experiences, Time since onset of injury/illness/exacerbation, and 1-2 comorbidities:    are also affecting patient's functional outcome.  ? ? ?REHAB POTENTIAL: Good ? ?CLINICAL DECISION MAKING: Stable/uncomplicated ? ?EVALUATION COMPLEXITY: Low ? ? ?GOALS: ? ? ?SHORT TERM GOALS: ? ?Pt will become independent with HEP in order to demonstrate synthesis of PT education. ? ? ?Target date: 04/23/2022 ?Goal status: INITIAL ? ?2.  Pt will be able to demonstrate reciprocal ascend and descend on stairs with single UE support in order to demonstrate functional improvement in LE function for self-care and house hold duties. ? ? ?  Target date: 04/23/2022 ?Goal status: INITIAL ? ?3.  Pt will score at least 19 pt increase on FOTO to demonstrate functional improvement in MCII and pt perceived function.   ?Target date: 04/23/2022 ?Goal status: INITIAL ? ? ?LONG TERM GOALS: ? ?Pt  will become independent with final HEP in order to demonstrate synthesis of PT education. ? ? ?Target date: 06/04/2022 ?Goal status: INITIAL ? ?2.  Pt will score >/= 59 on FOTO to demonstrate improvement in perceived R knee function.  ? ?Target date: 06/04/2022 ?Goal status: INITIAL ? ?3.  Pt will be able to demonstrate ability sit with legs crossed without pain in order to demonstrate functional improvement in LE function for self-care and dressing/bathing.  ? ?Target date: 06/04/2022 ?Goal status: INITIAL ? ?4.  Pt will be able to demonstrate ability to perform DL squat below parallel without pain in order to demonstrate functional improvement and  tolerance to transfers, ADL, and daily house duties. ? ?Target date: 06/04/2022 ?Goal status: INITIAL ? ? ?PLAN: ?PT FREQUENCY: 1-2x/week ? ?PT DURATION: 12 weeks (likely D/C by 8 wks) ? ?PLANNED INTERVENTIO

## 2022-03-14 ENCOUNTER — Ambulatory Visit: Payer: 59

## 2022-03-16 ENCOUNTER — Other Ambulatory Visit: Payer: Self-pay | Admitting: Nurse Practitioner

## 2022-03-16 DIAGNOSIS — K580 Irritable bowel syndrome with diarrhea: Secondary | ICD-10-CM

## 2022-03-19 ENCOUNTER — Encounter (HOSPITAL_BASED_OUTPATIENT_CLINIC_OR_DEPARTMENT_OTHER): Payer: 59 | Admitting: Physical Therapy

## 2022-03-26 ENCOUNTER — Encounter (HOSPITAL_BASED_OUTPATIENT_CLINIC_OR_DEPARTMENT_OTHER): Payer: Self-pay | Admitting: Physical Therapy

## 2022-03-26 ENCOUNTER — Ambulatory Visit (HOSPITAL_BASED_OUTPATIENT_CLINIC_OR_DEPARTMENT_OTHER): Payer: 59 | Attending: Orthopaedic Surgery | Admitting: Physical Therapy

## 2022-03-26 DIAGNOSIS — M25561 Pain in right knee: Secondary | ICD-10-CM | POA: Diagnosis not present

## 2022-03-26 DIAGNOSIS — M6281 Muscle weakness (generalized): Secondary | ICD-10-CM | POA: Insufficient documentation

## 2022-03-26 DIAGNOSIS — R262 Difficulty in walking, not elsewhere classified: Secondary | ICD-10-CM | POA: Insufficient documentation

## 2022-03-26 NOTE — Therapy (Signed)
?OUTPATIENT PHYSICAL THERAPY LOWER EXTREMITY treatment  ? ? ?Patient Name: Phyllis Gentry ?MRN: [id number] ?DOB:[birth year], 36 y.o., female ?Today's Date: 03/26/2022 ? ? PT End of Session - 03/26/22 1301   ? ? Visit Number 4   ? Number of Visits 15   ? Date for PT Re-Evaluation 05/29/22   ? Authorization Type [employer]   ? PT Start Time 1302   ? PT Stop Time 1340   ? PT Time Calculation (min) 38 min   ? Activity Tolerance Patient tolerated treatment well   ? Behavior During Therapy [facility] for tasks assessed/performed   ? ?  ?  ? ?  ? ? ?Past Medical History:  ?Diagnosis Date  ? Hypertension   ? Lymphedema   ? PCOS (polycystic ovarian syndrome)   ? Prediabetes   ? ?Past Surgical History:  ?Procedure Laterality Date  ? CHOLECYSTECTOMY    ? DILATION AND CURETTAGE OF UTERUS    ? ?Patient Active Problem List  ? Diagnosis Date Noted  ? Menstrual periods irregular 03/03/2022  ? 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 Recurrent major depressive disorder, in partial remission (HCC) 03/03/2022  ? Class 3 severe obesity due to excess calories without serious comorbidity with body mass index (BMI) of 50.0 to 59.9 in adult [facility]) 02/15/2022  ? Irritable bowel syndrome with diarrhea 01/27/2022  ? H/O atrial fibrillation without current medication 01/27/2022  ? Lymphedema 01/27/2022  ? Polycystic ovary disease 01/27/2022  ? Body mass index (BMI) of 50-59.9 in adult [facility]) 01/27/2022  ? Fibromyalgia 01/25/2022  ? Hypertension 01/25/2022  ? Atrial fibrillation (HCC) 07/07/2021  ? Anxiety 07/07/2021  ? Asthma 07/07/2021  ? Psoriatic arthritis (HCC) 02/10/2021  ? ? ?PCP: Carlean Jews, NP ? ?REFERRING PROVIDER: Carlean Jews, NP ? ?REFERRING DIAG: M22.2X1 (ICD-10-CM) - Patellofemoral pain syndrome of right knee ? ?THERAPY DIAG:  ?Pain in joint of right knee ? ?Difficulty walking ? ?Muscle weakness (generalized) ? ?ONSET DATE: 10/2021 ? ?SUBJECTIVE:  ? ?SUBJECTIVE STATEMENT: ?Pt states that back and hip pain has been  a historical issue. Pt states the knee pops and click more but it is not painful. Pt states she is able to walk a little further and feels it is stronger but it is not significantly changed in terms of pain. ? ? ?Eval: Pt states that the R knee pain has been going on since November. Pt denies any specific MOI. Pt states she moved here in November and getting out of low bed possibly caused pain.  Pt states that knee will occasionally buckle. Pt denies feeling of locking/catching. Pt will occasionally be behind the knee. She states she has stumbled bc of the knee.  Pt  states that the pain really has been at baseline since she was 14. Pt states she feels like the knee dislocated as a child. She states she uses a sleeve on occasion. Pt states that is sometimes to tender to touch. "It just feels weak." Pt has not done any specific exercises. Pt states L leg is dominant, but would kick R leg. Pt denies cancer red flags and systemic symptoms.  ? ?PERTINENT HISTORY: ?Obesity, anxiety, psoriatic arthritis  ? ?PAIN:  ?Are you having pain? No 3/21 just when she is up on them  ?NPRS scale: 2-3/10, Worst 10/10 ?Pain location: Hip and knee  ?Pain orientation: Right  ?Pain description:  constant, sharp, shooting   ?Aggravating factors: moving, "motion," walking, stairs, bending/squatting, sitting cross legged ?Relieving factors: ice/heat,  CBD cream ? ?PRECAUTIONS: None ? ?WEIGHT BEARING RESTRICTIONS No ? ?FALLS:  ?Has patient fallen in last 6 months? No, Number of falls: 0 ? ?LIVING ENVIRONMENT: ?Lives with: lives with their family ?Lives in: House/apartment ?Stairs: no ?Has following equipment at home: None ? 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 ?PLOF: Independent with basic ADLs ? ?PATIENT GOALS : Pt would like to get legs stronger. To be able to squat, to be able to sit with legs crossed.  ? ? ?OBJECTIVE:  ? ?TODAY'S TREATMENT: ? ?4/4 ? ?Patellar K taping for desensitization and "centering" - X  pattern ? ?Nu-step 7 min lvl 2 ?SLR 1lb 2x10 bilateral  ?Sidestepping YTB at ankles 6735fts x2 ?Standing wall sit 15s 4x ?Seated knee ext isometric 5s 10x at 80 deg ?LAQ 5lbs 2x10 ?Standing HR 2x20 ? ? ? ?3/21 ? ?Nu-step 5 min  ?SLR 2x10 bilateral  ?Bridge with band 2x10 ?Supine march 2x10  ? ? ?Standing hip abduction x10  ?Standing hip extension x10  ? ? ? ?Row 2x10 with cuing for core  ?Shoulder extension 2x10 with cuing for  ? ?3/14 ?Nu-step 5 min  ?SLR 3x10  ?Bridge with band 2x10 ?Clamshell with band 3x10  ? ?Quad stretch for home 3x20 sec hold  ?Hamstring stretch seated 3x20 sec hold  ? ? ?Standing hip abduction x10 each side  ?Standing hip extension 2x10  ? ? ? ? ?Eval ?Exercises ?Wall Sit - 1 x daily - 7 x weekly - 1 sets - 5 reps - 10 hold ?Side Stepping with Resistance at Thighs - 1 x daily - 7 x weekly - 2 sets - 1 reps - 3230ft hold ?Seated Long Arc Quad - 1 x daily - 7 x weekly - 3 sets - 10 reps ? ? ?PATIENT EDUCATION:  ?Education details: Reviewed HEP and symptom management  ?envelope of function, HEP, POC  ?Person educated: Patient ?Education method: Explanation, Demonstration, Tactile cues, Verbal cues, and Handouts ?Education comprehension: verbalized understanding, returned demonstration, verbal cues required, and tactile cues required ? ? ?HOME EXERCISE PROGRAM: ?Access Code: [email or web address] ?URL: [email or web address] ?Date: 02/28/2022 ?Prepared by: Zebedee IbaAlan Trequan Marsolek ? ? ? ? ?ASSESSMENT: ? ?CLINICAL IMPRESSION: ?Pt with report of improved feeling of "support" with K-taping for desensitization. Pt able to continue with quad strengthening and exercise at today's session without increase in pain. Pt is largely strength limited with significant fatigue with sustained repetitions. Even with SLR, pt has extensor lag on R side. Pt was given additional hip and quad strengthening exercise for home and given edu about self taping technique. Plan to continue with quad strength as able.  ? ?OBJECTIVE IMPAIRMENTS  Abnormal gait, decreased activity tolerance, decreased mobility, difficulty walking, decreased ROM, decreased strength, hypomobility, increased muscle spasms, impaired flexibility, improper body mechanics, postural dysfunction, obesity, and pain.  ? ?ACTIVITY LIMITATIONS cleaning, community activity, driving, occupation, Pharmacologistlaundry, yard work, shopping, and Interior and spatial designerexercise/recreation .  ? ?PERSONAL FACTORS Age, Behavior pattern, Fitness, Past/current experiences, Time since onset of injury/illness/exacerbation, and 1-2 comorbidities:    are also affecting patient's functional outcome.  ? ? ?REHAB POTENTIAL: Good ? ?CLINICAL DECISION MAKING: Stable/uncomplicated ? ?EVALUATION COMPLEXITY: Low ? ? ?GOALS: ? ? ?SHORT TERM GOALS: ? ?Pt will become independent with HEP in order to demonstrate synthesis of PT education. ? ? ?Target date: 05/07/2022 ?Goal status: INITIAL ? ?2.  Pt will be able to demonstrate reciprocal ascend and descend on stairs with single UE support in order to demonstrate functional improvement in  LE function for self-care and house hold duties. ? ? ?Target date: 05/07/2022 ?Goal status: INITIAL ? ?3.  Pt will score at least 19 pt increase on FOTO to demonstrate functional improvement in MCII and pt perceived function.   ?Target date: 05/07/2022 ?Goal status: INITIAL ? ? ?LONG TERM GOALS: ? ?Pt  will become independent with final HEP in order to demonstrate synthesis of PT education. ? ? ?Target date: 06/18/2022 ?Goal status: INITIAL ? ?2.  Pt will score >/= 59 on FOTO to demonstrate improvement in perceived R knee function.  ? ?Target date: 06/18/2022 ?Goal status: INITIAL ? ?3.  Pt will be able to demonstrate ability sit with legs crossed without pain in order to demonstrate functional improvement in LE function for self-care and dressing/bathing.  ? ?Target date: 06/18/2022 ?Goal status: INITIAL ? ?4.  Pt will be able to demonstrate ability to perform DL squat below parallel without pain in order to demonstrate  functional improvement and tolerance to transfers, ADL, and daily house duties. ? ?Target date: 06/18/2022 ?Goal status: INITIAL ? ? ?PLAN: ?PT FREQUENCY: 1-2x/week ? ?PT DURATION: 12 weeks (likely D/C

## 2022-04-12 ENCOUNTER — Ambulatory Visit (HOSPITAL_BASED_OUTPATIENT_CLINIC_OR_DEPARTMENT_OTHER): Payer: 59 | Admitting: Physical Therapy

## 2022-04-12 NOTE — Therapy (Incomplete)
?OUTPATIENT PHYSICAL THERAPY LOWER EXTREMITY treatment  ? ? ?Patient Name: Phyllis Gentry ?MRN: [id number] ?DOB:04-01-1986, 36 y.o., female ?Today's Date: 04/12/2022 ? ? ? ? ?Past Medical History:  ?Diagnosis Date  ? Hypertension   ? Lymphedema   ? PCOS (polycystic ovarian syndrome)   ? Prediabetes   ? ?Past Surgical History:  ?Procedure Laterality Date  ? CHOLECYSTECTOMY    ? DILATION AND CURETTAGE OF UTERUS    ? ?Patient Active Problem List  ? Diagnosis Date Noted  ? Menstrual periods irregular 03/03/2022  ? 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 Recurrent major depressive disorder, in partial remission (HCC) 03/03/2022  ? Class 3 severe obesity due to excess calories without serious comorbidity with body mass index (BMI) of 50.0 to 59.9 in adult [facility](HCC) 02/15/2022  ? Irritable bowel syndrome with diarrhea 01/27/2022  ? H/O atrial fibrillation without current medication 01/27/2022  ? Lymphedema 01/27/2022  ? Polycystic ovary disease 01/27/2022  ? Body mass index (BMI) of 50-59.9 in adult [facility](HCC) 01/27/2022  ? Fibromyalgia 01/25/2022  ? Hypertension 01/25/2022  ? Atrial fibrillation (HCC) 07/07/2021  ? Anxiety 07/07/2021  ? Asthma 07/07/2021  ? Psoriatic arthritis (HCC) 02/10/2021  ? ? ?PCP: Carlean JewsBoscia, Heather E, NP ? ?REFERRING PROVIDER: Carlean JewsBoscia, Heather E, NP ? ?REFERRING DIAG: M22.2X1 (ICD-10-CM) - Patellofemoral pain syndrome of right knee ? ?THERAPY DIAG:  ?No diagnosis found. ? ?ONSET DATE: 10/2021 ? ?SUBJECTIVE:  ? ?SUBJECTIVE STATEMENT: ?Pt states that back and hip pain has been a historical issue. Pt states the knee pops and click more but it is not painful. Pt states she is able to walk a little further and feels it is stronger but it is not significantly changed in terms of pain. ? ? ?Eval: Pt states that the R knee pain has been going on since November. Pt denies any specific MOI. Pt states she moved here in November and getting out of low bed possibly caused pain.  Pt states that knee will  occasionally buckle. Pt denies feeling of locking/catching. Pt will occasionally be behind the knee. She states she has stumbled bc of the knee.  Pt  states that the pain really has been at baseline since she was 14. Pt states she feels like the knee dislocated as a child. She states she uses a sleeve on occasion. Pt states that is sometimes to tender to touch. "It just feels weak." Pt has not done any specific exercises. Pt states L leg is dominant, but would kick R leg. Pt denies cancer red flags and systemic symptoms.  ? ?PERTINENT HISTORY: ?Obesity, anxiety, psoriatic arthritis  ? ?PAIN:  ?Are you having pain? No 3/21 just when she is up on them  ?NPRS scale: 2-3/10, Worst 10/10 ?Pain location: Hip and knee  ?Pain orientation: Right  ?Pain description:  constant, sharp, shooting   ?Aggravating factors: moving, "motion," walking, stairs, bending/squatting, sitting cross legged ?Relieving factors: ice/heat, CBD cream ? ?PRECAUTIONS: None ? ?WEIGHT BEARING RESTRICTIONS No ? ?FALLS:  ?Has patient fallen in last 6 months? No, Number of falls: 0 ? ?LIVING ENVIRONMENT: ?Lives with: lives with their family ?Lives in: House/apartment ?Stairs: no ?Has following equipment at home: None ? 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 ?PLOF: Independent with basic ADLs ? ?PATIENT GOALS : Pt would like to get legs stronger. To be able to squat, to be able to sit with legs crossed.  ? ? ?OBJECTIVE:  ? ?TODAY'S TREATMENT: ? ?4/4 ? ?Patellar  K taping for desensitization and "centering" - X pattern ? ?Nu-step 7 min lvl 2 ?SLR 1lb 2x10 bilateral  ?Sidestepping YTB at ankles 20fts x2 ?Standing wall sit 15s 4x ?Seated knee ext isometric 5s 10x at 80 deg ?LAQ 5lbs 2x10 ?Standing HR 2x20 ? ? ? ?3/21 ? ?Nu-step 5 min  ?SLR 2x10 bilateral  ?Bridge with band 2x10 ?Supine march 2x10  ? ? ?Standing hip abduction x10  ?Standing hip extension x10  ? ? ? ?Row 2x10 with cuing for core  ?Shoulder extension 2x10 with cuing  for  ? ?3/14 ?Nu-step 5 min  ?SLR 3x10  ?Bridge with band 2x10 ?Clamshell with band 3x10  ? ?Quad stretch for home 3x20 sec hold  ?Hamstring stretch seated 3x20 sec hold  ? ? ?Standing hip abduction x10 each side  ?Standing hip extension 2x10  ? ? ? ? ?Eval ?Exercises ?Wall Sit - 1 x daily - 7 x weekly - 1 sets - 5 reps - 10 hold ?Side Stepping with Resistance at Thighs - 1 x daily - 7 x weekly - 2 sets - 1 reps - 82ft hold ?Seated Long Arc Quad - 1 x daily - 7 x weekly - 3 sets - 10 reps ? ? ?PATIENT EDUCATION:  ?Education details: Reviewed HEP and symptom management  ?envelope of function, HEP, POC  ?Person educated: Patient ?Education method: Explanation, Demonstration, Tactile cues, Verbal cues, and Handouts ?Education comprehension: verbalized understanding, returned demonstration, verbal cues required, and tactile cues required ? ? ?HOME EXERCISE PROGRAM: ?Access Code: [facility] ?URL: [email or web address] ?Date: 02/28/2022 ?Prepared by: Zebedee Iba ? ? ? ? ?ASSESSMENT: ? ?CLINICAL IMPRESSION: ?Pt with report of improved feeling of "support" with K-taping for desensitization. Pt able to continue with quad strengthening and exercise at today's session without increase in pain. Pt is largely strength limited with significant fatigue with sustained repetitions. Even with SLR, pt has extensor lag on R side. Pt was given additional hip and quad strengthening exercise for home and given edu about self taping technique. Plan to continue with quad strength as able.  ? ?OBJECTIVE IMPAIRMENTS Abnormal gait, decreased activity tolerance, decreased mobility, difficulty walking, decreased ROM, decreased strength, hypomobility, increased muscle spasms, impaired flexibility, improper body mechanics, postural dysfunction, obesity, and pain.  ? ?ACTIVITY LIMITATIONS cleaning, community activity, driving, occupation, Pharmacologist, yard work, shopping, and Interior and spatial designer .  ? ?PERSONAL FACTORS Age, Behavior pattern,  Fitness, Past/current experiences, Time since onset of injury/illness/exacerbation, and 1-2 comorbidities:    are also affecting patient's functional outcome.  ? ? ?REHAB POTENTIAL: Good ? ?CLINICAL DECISION MAKING: Stable/uncomplicated ? ?EVALUATION COMPLEXITY: Low ? ? ?GOALS: ? ? ?SHORT TERM GOALS: ? ?Pt will become independent with HEP in order to demonstrate synthesis of PT education. ? ? ?Target date: 05/24/2022 ?Goal status: INITIAL ? ?2.  Pt will be able to demonstrate reciprocal ascend and descend on stairs with single UE support in order to demonstrate functional improvement in LE function for self-care and house hold duties. ? ? ?Target date: 05/24/2022 ?Goal status: INITIAL ? ?3.  Pt will score at least 19 pt increase on FOTO to demonstrate functional improvement in MCII and pt perceived function.   ?Target date: 05/24/2022 ?Goal status: INITIAL ? ? ?LONG TERM GOALS: ? ?Pt  will become independent with final HEP in order to demonstrate synthesis of PT education. ? ? ?Target date: 07/05/2022 ?Goal status: INITIAL ? ?2.  Pt will score >/= 59 on FOTO to demonstrate improvement in perceived R knee function.  ? ?Target  date: 07/05/2022 ?Goal status: INITIAL ? ?3.  Pt will be able to demonstrate ability sit with legs crossed without pain in order to demonstrate functional improvement in LE function for self-care and dressing/bathing.  ? ?Target date: 07/05/2022 ?Goal status: INITIAL ? ?4.  Pt will be able to demonstrate ability to perform DL squat below parallel without pain in order to demonstrate functional improvement and tolerance to transfers, ADL, and daily house duties. ? ?Target date: 07/05/2022 ?Goal status: INITIAL ? ? ?PLAN: ?PT FREQUENCY: 1-2x/week ? ?PT DURATION: 12 weeks (likely D/C by 8 wks) ? ?PLANNED INTERVENTIONS: Therapeutic exercises, Therapeutic activity, Neuromuscular re-education, Balance training, Gait training, Patient/Family education, Joint manipulation, Joint mobilization, Stair training,  Aquatic Therapy, Dry Needling, Electrical stimulation, Spinal manipulation, Spinal mobilization, Cryotherapy, Moist heat, scar mobilization, Taping, Vasopneumatic device, Traction, Ultrasound, Ionotophoresis 26m

## 2022-04-15 ENCOUNTER — Ambulatory Visit (INDEPENDENT_AMBULATORY_CARE_PROVIDER_SITE_OTHER): Payer: 59 | Admitting: Orthopaedic Surgery

## 2022-04-15 ENCOUNTER — Other Ambulatory Visit: Payer: Self-pay | Admitting: Nurse Practitioner

## 2022-04-15 ENCOUNTER — Telehealth: Payer: Self-pay | Admitting: Nurse Practitioner

## 2022-04-15 DIAGNOSIS — I10 Essential (primary) hypertension: Secondary | ICD-10-CM

## 2022-04-15 DIAGNOSIS — M222X1 Patellofemoral disorders, right knee: Secondary | ICD-10-CM | POA: Diagnosis not present

## 2022-04-15 DIAGNOSIS — I4891 Unspecified atrial fibrillation: Secondary | ICD-10-CM

## 2022-04-15 MED ORDER — PROPRANOLOL HCL 40 MG PO TABS: 40.0000 mg | ORAL_TABLET | Freq: Two times a day (BID) | ORAL | 2 refills | Status: DC

## 2022-04-15 NOTE — Progress Notes (Signed)
? ?                            ? ? ?Chief Complaint: right knee pain ?  ? ? ?History of Present Illness:  ? ?04/15/2022: Presents today for follow-up of her right knee pain.  Overall she is working with physical therapy at least once weekly with Freida Busman.  She has noted strength deficits on this right side nearly 50% on the contralateral side.  She is slowly methodically working through this and overall does continue to feel better. ? ? ?Phyllis Gentry is a 36 y.o. female presents with right knee pain going on since November.  She did not have any specific injury or incident.  She states that getting up out of the mattress really aggravated it.  [facility] who told her that her diagnosis was osteoarthritis.  She does endorse some clicking and popping particularly behind the kneecap.  She is not had any injections.  She continues to complain of knee buckling.  She is unable to trial anti-inflammatories as she has severe allergies to medications.  She uses an over-the-counter knee sleeve.  She states that the pain really has been at baseline since she was 14 ? ? ? ?Surgical History:   ?None ? ?PMH/PSH/Family History/Social History/Meds/Allergies:   ? ?Past Medical History:  ?Diagnosis Date  ? Hypertension   ? Lymphedema   ? PCOS (polycystic ovarian syndrome)   ? Prediabetes   ? ? ?Social History  ? ?Socioeconomic History  ? Marital status: Married  ?  Spouse name: Not on file  ? Number of children: Not on file  ? Years of education: Not on file  ? Highest education level: Not on file  ?Occupational History  ? Not on file  ?Tobacco Use  ? Smoking status: Never  ? Smokeless tobacco: Never  ?Vaping Use  ? Vaping Use: Never used  ?Substance and Sexual Activity  ? Alcohol use: Not Currently  ? Drug use: Not Currently  ? Sexual activity: Yes  ?  Partners: Male  ?  Comment: partner is transgener female  ?Other Topics Concern  ? Not on file  ?Social History Narrative  ? Not on file   ? ?Social Determinants of Health  ? ?Financial Resource Strain: Not on file  ?Food Insecurity: Not on file  ?Transportation Needs: Not on file  ?Physical Activity: Not on file  ?Stress: Not on file  ?Social Connections: Not on file  ? ?Family History  ?Problem Relation Age of Onset  ? Dementia Mother   ? Breast cancer Mother   ?     onset 19, 2nd 2016  ? Heart disease Father   ? Breast cancer Maternal Aunt   ?     onset 67  ? Breast cancer Other   ?     maternal first cousin, onset 36  ? ?Allergies  ?Allergen Reactions  ? Aminophylline Anaphylaxis  ? Cefaclor Anaphylaxis  ? Cephalosporins Anaphylaxis  ? Diphenhydramine Anaphylaxis  ? Sulfa Antibiotics Anaphylaxis  ? Clarithromycin   ? Iodinated Contrast Media   ? Iodine   ?  Other reaction(s): Unknown  ? ?Current Outpatient Medications  ?Medication Sig Dispense Refill  ? albuterol (VENTOLIN HFA) 108 (90 Base) MCG/ACT inhaler Inhale into the lungs.    ? Cholecalciferol (VITAMIN D) 125 MCG (5000 UT) CAPS     ? Cholecalciferol 25 MCG (1000 UT)  capsule Take by mouth.    ? Cyanocobalamin 1000 MCG SUBL PLACE 1 TABLET(S) EVERY DAY BY SUBLINGUAL ROUTE.    ? dicyclomine (BENTYL) 20 MG tablet TAKE 1 TABLET BY MOUTH EVERY 6 HOURS. 90 tablet 1  ? escitalopram (LEXAPRO) 10 MG tablet Take 1 tablet (10 mg total) by mouth daily. 90 tablet 1  ? famotidine (PEPCID) 10 MG tablet     ? fexofenadine (ALLEGRA) 180 MG tablet     ? folic acid (FOLVITE) 1 MG tablet Take 1 tablet (1 mg total) by mouth daily. 90 tablet 1  ? medroxyPROGESTERone (PROVERA) 10 MG tablet Take 1 tablet (10 mg total) by mouth daily. 10 tablet 2  ? metFORMIN (GLUCOPHAGE) 500 MG tablet Take 1 tablet (500 mg total) by mouth 2 (two) times daily with a meal. 180 tablet 0  ? Omega-3 Fatty Acids (FISH OIL) 1000 MG CAPS     ? propranolol (INDERAL) 40 MG tablet propranolol 40 mg tablet ? Take 1 tablet twice a day by oral route.    ? vitamin B-12 (CYANOCOBALAMIN) 100 MCG tablet     ? ?No current facility-administered  medications for this visit.  ? ?No results found. ? ?Review of Systems:   ?A ROS was performed including pertinent positives and negatives as documented in the HPI. ? ?Physical Exam :   ?Constitutional: NAD and appears stated age ?Neurological: Alert and oriented ?Psych: Appropriate affect and cooperative ?There were no vitals taken for this visit.  ? ?Comprehensive Musculoskeletal Exam:   ? ?  ?Musculoskeletal Exam  ?Gait Normal  ?Alignment Normal  ? Right Left  ?Inspection Normal Normal  ?Palpation    ?Tenderness Patellofemoral None  ?Crepitus None None  ?Effusion None None  ?Range of Motion    ?Extension 0 0  ?Flexion 135 135  ?Strength    ?Extension 5/5 5/5  ?Flexion 5/5 5/5  ?Ligament Exam     ?Generalized Laxity No No  ?Lachman Negative Negative   ?Pivot Shift Negative Negative  ?Anterior Drawer Negative Negative  ?Valgus at 0 Negative Negative  ?Valgus at 20 Negative Negative  ?Varus at 0 0 0  ?Varus at 20   0 0  ?Posterior Drawer at 90 0 0  ?Vascular/Lymphatic Exam    ?Edema None None  ?Venous Stasis Changes No No  ?Distal Circulation Normal Normal  ?Neurologic    ?Light Touch Sensation Intact Intact  ?Special Tests:   ? ? ? ?Imaging:   ?Xray (4 views right knee): ?Normal ? ?MRI right knee: ?Significant lateral patellar tilt and otherwise normal ? ?I personally reviewed and interpreted the radiographs. ? ? ?Assessment:   ?36 year old female with persistent chronic right knee pain consistent with patellofemoral pain.  Overall I have advised that I do feel strongly about her continuing her strengthening program.  She will work on doing this more at home.  She will work on this up to 2-3 times weekly.  She does continue to improve slowly which I have advised can oftentimes take months to regain the strength deficit.  She will see [place] on an as-needed basis ? ?Plan :   ? ?-Return to clinic as needed ? ? ? ? ?I personally saw and evaluated the patient, and participated in the management and treatment  plan. ? ?Huel Cote, MD ?Attending Physician, Orthopedic Surgery ? ?This document was dictated using Conservation officer, historic buildings. A reasonable attempt at proof reading has been made to minimize errors. ?

## 2022-04-15 NOTE — Telephone Encounter (Signed)
Renewed propranolol 40mg twice daily and sent to [facility] [address].  ?

## 2022-04-15 NOTE — Telephone Encounter (Signed)
Called pt LVM to contact the office °

## 2022-04-15 NOTE — Progress Notes (Signed)
Renewed propranolol 40mg  twice daily and sent to [facility] [address].  ?

## 2022-04-15 NOTE — Telephone Encounter (Signed)
Patient is requesting a refill of the propranolol. Please advise. [phone] ?

## 2022-04-18 DIAGNOSIS — F411 Generalized anxiety disorder: Secondary | ICD-10-CM | POA: Insufficient documentation

## 2022-05-07 ENCOUNTER — Encounter (HOSPITAL_BASED_OUTPATIENT_CLINIC_OR_DEPARTMENT_OTHER): Payer: Self-pay

## 2022-05-07 NOTE — Telephone Encounter (Signed)
Please advise 

## 2022-05-14 ENCOUNTER — Encounter: Payer: Self-pay | Admitting: Nurse Practitioner

## 2022-05-19 ENCOUNTER — Other Ambulatory Visit: Payer: Self-pay | Admitting: Nurse Practitioner

## 2022-05-21 ENCOUNTER — Other Ambulatory Visit: Payer: Self-pay

## 2022-05-21 DIAGNOSIS — R7303 Prediabetes: Secondary | ICD-10-CM

## 2022-05-21 MED ORDER — METFORMIN HCL 500 MG PO TABS: 500.0000 mg | ORAL_TABLET | Freq: Two times a day (BID) | ORAL | 0 refills | Status: DC

## 2022-05-21 MED ORDER — ALBUTEROL SULFATE HFA 108 (90 BASE) MCG/ACT IN AERS: 2.0000 | INHALATION_SPRAY | RESPIRATORY_TRACT | 1 refills | Status: DC | PRN

## 2022-05-22 ENCOUNTER — Other Ambulatory Visit (HOSPITAL_BASED_OUTPATIENT_CLINIC_OR_DEPARTMENT_OTHER): Payer: Self-pay | Admitting: Nurse Practitioner

## 2022-05-22 DIAGNOSIS — Z1231 Encounter for screening mammogram for malignant neoplasm of breast: Secondary | ICD-10-CM

## 2022-05-23 ENCOUNTER — Ambulatory Visit (INDEPENDENT_AMBULATORY_CARE_PROVIDER_SITE_OTHER): Payer: 59

## 2022-05-23 DIAGNOSIS — Z1231 Encounter for screening mammogram for malignant neoplasm of breast: Secondary | ICD-10-CM | POA: Diagnosis not present

## 2022-05-31 ENCOUNTER — Encounter: Payer: Self-pay | Admitting: Nurse Practitioner

## 2022-06-03 ENCOUNTER — Other Ambulatory Visit: Payer: Self-pay

## 2022-06-03 DIAGNOSIS — F3341 Major depressive disorder, recurrent, in partial remission: Secondary | ICD-10-CM

## 2022-06-03 MED ORDER — ESCITALOPRAM OXALATE 10 MG PO TABS: 10.0000 mg | ORAL_TABLET | Freq: Every day | ORAL | 1 refills | Status: DC

## 2022-06-04 ENCOUNTER — Encounter: Payer: Self-pay | Admitting: Radiology

## 2022-06-04 ENCOUNTER — Ambulatory Visit (INDEPENDENT_AMBULATORY_CARE_PROVIDER_SITE_OTHER): Payer: 59 | Admitting: Radiology

## 2022-06-04 VITALS — BP 132/84 | Wt 298.0 lb

## 2022-06-04 DIAGNOSIS — R7303 Prediabetes: Secondary | ICD-10-CM | POA: Diagnosis not present

## 2022-06-04 DIAGNOSIS — E282 Polycystic ovarian syndrome: Secondary | ICD-10-CM

## 2022-06-04 MED ORDER — MEDROXYPROGESTERONE ACETATE 10 MG PO TABS: 10.0000 mg | ORAL_TABLET | Freq: Every day | ORAL | 2 refills | Status: DC

## 2022-06-04 MED ORDER — METFORMIN HCL 500 MG PO TABS: 500.0000 mg | ORAL_TABLET | Freq: Two times a day (BID) | ORAL | 3 refills | Status: DC

## 2022-06-04 NOTE — Progress Notes (Addendum)
06/04/2022 Phyllis Gentry [id number] 09-18-1986   CHIEF COMPLAINT: Diarrhea, abdominal pain, GERD  HISTORY OF PRESENT ILLNESS: Phyllis Gentry. Phyllis Gentry is a 36 year old female with a past medical history of anxiety and depression, hypertension, atrial fibrillation x 1 in 2018 without recurrence, asthma, prediabetes, PCOS, fibromyalgia, psoriatic arthritis, LE lymphedema uses a leg lymph pump, GERD, chronic abdominal pain, chronic diarrhea and lower GI bleed 2018 when on Pradaxa briefly for afib. S/P cholecystectomy 04/16/2017. Past D & C.   She presents to our office today for further evaluation regarding chronic GERD, abdominal pain and diarrhea. She is accompanied by her mother. She reported having acid reflux symptoms since age 75 and chronic diarrhea since childhood. Heartburn is fairly well controlled as long as she takes Famotidine 10mg  at bed time. No dysphagia. She passes nonbloody mud to watery diarrhea stools 2 to 3 times daily and sometimes up to 15 episodes of diarrhea daily. Diarrhea was not worse s/p cholecystectomy. She has variable abdominal pain described as an upside down U pattern, starts from the mid right upper abdomen across the upper abdomen and to the left mid abdomen which can last for several hours or all day. No significant pain at night time. She has urgent diarrhea stools that come and go, sometimes feels like she needs to pass a BM but can't.  Sometimes her diarrhea and abdominal pain can last for a few days or for 1 to 2 months then diminishes for a few weeks. She takes Dicyclomine as needed for abdominal pain with minimal relief. She sometimes sees a small amount of bright red blood in the toilet water mixed with the diarrhea if she's passed numerous BMs. She takes Motrin 200mg  two tabs once weekly or less for abdominal pain. She underwent 2 colonoscopies and one flexible sigmoidoscopy when living in [place]. Her most recent flexible sigmoidoscopy was 06/12/2017 which  showed some inflammation to the left colon. Colonoscopy 11/04/2014 showed hemorrhoids. She had diarrhea for many years prior to starting Metformin for PCOS and prediabetes. Her diarrhea and abdominal pain did not improve when she went on a gluten diet for a few weeks. Great grandfather had colon cancer. No family history of IBD.   She underwent an EGD  06/12/2017 and 11/04/2014 which showed gastritis. No known history of celiac disease.   She has significant nausea with uncontrollable vomiting with general anesthesia. No problems with the sedation used during her past EGDs and colonoscopies. She has a history of anxiety and depression for which she sees a therapist once weekly and a psychiatrist once monthly.    History of afib in setting of severe hypothyroidism and "one week later" she underwent a cholecystectomy. She was apparently on Pradaxa for a brief period of time. No further obvious afib since she underwent a cholecystectomy. No longer followed by cardiology.   PAST GI PROCEDURES:   EGD 11/04/2014: Esophagus: The esophagus was normal. The EG junction appeared normal, as well. There was no evidence of erythema or ulcers. Stomach: The gastric antrum had mild mucosal erythema and was biopsied. The cardia, fundus and body were unremarkable. The retroflexed view of the fundus was unremarkable. Duodenum: The duodenal bulb was normal. The second portion of the duodenum was normal but was biopsied for celiac sprue  Colonoscopy 11/04/2014: Cecum: Normal Ileocecal valve: Normal Ascending colon: Normal, the right colon was biopsied to rule out microscopic colitis Transverse colon: Normal Descending colon: Normal Sigmoid colon: Normal, the left colon was biopsied to  rule out microscopic colitis Rectum: Internal hemorrhoids otherwise normal including retroflexion. Digital exam: Normal  EGD 06/12/2017: Esophagus: The esophagus was normal. The Z-line was regular. Stomach: The gastric antrum and  body were mildly erythematous. The retroflexed view of the fundus was unremarkable. Duodenum: The duodenal bulb was normal. The second portion of the duodenum was normal.  Sigmoidoscopy 06/12/2017: Cecum: Not seen Ileocecal valve: Not seen Ascending colon: Not seen Transverse colon: Not seen Descending colon: Normal Sigmoid colon: Normal Rectum: Internal hemorrhoids otherwise normal including retroflexion. Digital exam: Normal Recommendations:  Rectal bleeding likely from internal hemorrhoids-no sigmoid mass.  Note, pathology reports unavailable in care everywhere, result attachments  were not available   ECHO 06/11/2021: 1. Left ventricle: The cavity size is normal. Normal thickness is present.     There are no regional wall motion abnormalities present. The left     ventricular ejection fraction is normal. The LVEF was determined by     visual estimate. Left ventricular ejection fraction is 55-60%. The left     ventricular filling pattern is normal. There is no evidence of diastolic     dysfunction present.  2. Right ventricle: The cavity size is normal. Right ventricular systolic     function is normal.  3. Mitral valve: The leaflets are mildly thickened. There is mild (1+)     mitral valve regurgitation.  4. Aortic valve: The valve is trileaflet. There is no aortic stenosis. There     is no valvular aortic regurgitation.  5. Tricuspid valve: The valve is structurally normal. The tricuspid leaflets     are not thickened. There is mild (1+) tricuspid valve regurgitation.  6. Pericardium, extracardiac: There is no pericardial effusion.     Latest Ref Rng & Units 01/28/2022    9:10 AM  CBC  WBC 3.4 - 10.8 x10E3/uL 9.2   Hemoglobin 11.1 - 15.9 g/dL 95.1   Hematocrit 88.4 - 46.6 % 42.2   Platelets 150 - 450 x10E3/uL 446        Latest Ref Rng & Units 01/28/2022    9:10 AM  CMP  Glucose 70 - 99 mg/dL 166   BUN 6 - 20 mg/dL 12   Creatinine 0.63 - 1.00 mg/dL 0.16   Sodium 010 -  932 mmol/L 137   Potassium 3.5 - 5.2 mmol/L 4.6   Chloride 96 - 106 mmol/L 99   CO2 20 - 29 mmol/L 22   Calcium 8.7 - 10.2 mg/dL 9.3   Total Protein 6.0 - 8.5 g/dL 7.2   Total Bilirubin 0.0 - 1.2 mg/dL 0.2   Alkaline Phos 44 - 121 IU/L 93   AST 0 - 40 IU/L 12   ALT 0 - 32 IU/L 9      Past Medical History:  Diagnosis Date   Hypertension    Lymphedema    PCOS (polycystic ovarian syndrome)    Prediabetes    Past Surgical History:  Procedure Laterality Date   CHOLECYSTECTOMY     DILATION AND CURETTAGE OF UTERUS     Social History: She is married. 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 Nonsmoker. No alcohol. No drug use.   Family History: family history includes Breast cancer in her maternal aunt, mother, and another family member; Dementia in her mother; Heart disease in her father. Great grandfather had colon cancer.   Allergies  Allergen Reactions   Aminophylline Anaphylaxis   Cefaclor Anaphylaxis   Cephalosporins Anaphylaxis   Diphenhydramine Anaphylaxis   Sulfa  Antibiotics Anaphylaxis   Clarithromycin    Iodinated Contrast Media    Iodine     Other reaction(s): Unknown      Outpatient Encounter Medications as of 06/05/2022  Medication Sig   albuterol (VENTOLIN HFA) 108 (90 Base) MCG/ACT inhaler Inhale 2 puffs into the lungs every 4 (four) hours as needed for wheezing or shortness of breath.   Cholecalciferol (VITAMIN D) 125 MCG (5000 UT) CAPS    Cholecalciferol 25 MCG (1000 UT) capsule Take by mouth.   Cyanocobalamin 1000 MCG SUBL PLACE 1 TABLET(S) EVERY DAY BY SUBLINGUAL ROUTE.   dicyclomine (BENTYL) 20 MG tablet TAKE 1 TABLET BY MOUTH EVERY 6 HOURS.   escitalopram (LEXAPRO) 10 MG tablet Take 1 tablet (10 mg total) by mouth daily.   famotidine (PEPCID) 10 MG tablet    fexofenadine (ALLEGRA) 180 MG tablet    folic acid (FOLVITE) 1 MG tablet Take 1 tablet (1 mg total) by mouth daily.   medroxyPROGESTERone (PROVERA) 10 MG tablet Take 1 tablet (10 mg  total) by mouth daily.   metFORMIN (GLUCOPHAGE) 500 MG tablet Take 1 tablet (500 mg total) by mouth 2 (two) times daily with a meal.   Omega-3 Fatty Acids (FISH OIL) 1000 MG CAPS    propranolol (INDERAL) 40 MG tablet Take 1 tablet (40 mg total) by mouth 2 (two) times daily.   vitamin B-12 (CYANOCOBALAMIN) 100 MCG tablet    No facility-administered encounter medications on file as of 06/05/2022.    REVIEW OF SYSTEMS:  Gen: + Fatigue. Denies fever, sweats or chills. No weight loss.  CV: Denies chest pain, palpitations or edema. Resp: Denies cough, shortness of breath of hemoptysis.  GI: Denies heartburn, dysphagia, stomach or lower abdominal pain. No diarrhea or constipation.  GU : Denies urinary burning, blood in urine, increased urinary frequency or incontinence. MS: + Back pain.  Derm: Denies rash, itchiness, skin lesions or unhealing ulcers. Psych:+ Anxiety and depression.  Heme: Denies bruising, easy bleeding. Neuro:  Denies headaches, dizziness or paresthesias. Endo:  + Pre-diabetes, hypothyroidism.   PHYSICAL EXAM: BP 130/82   Pulse 86   Ht 5\' 4"  (1.626 m)   Wt 299 lb (135.6 kg)   SpO2 98%   BMI 51.32 kg/m   Wt Readings from Last 3 Encounters:  06/05/22 299 lb (135.6 kg)  06/04/22 298 lb (135.2 kg)  03/01/22 298 lb (135.2 kg)    General: 36 year old obese female in NAD. Head: Normocephalic and atraumatic. Eyes:  Sclerae non-icteric, conjunctive pink. Ears: Normal auditory acuity. Mouth: Dentition intact. No ulcers or lesions.  Neck: Supple, no lymphadenopathy or thyromegaly.  Lungs: Clear bilaterally to auscultation without wheezes, crackles or rhonchi. Heart: Regular rate and rhythm. No murmur, rub or gallop appreciated.  Abdomen: Soft, non distended. Generalized tenderness RLQ > LLQ without rebound or guarding. No masses. No hepatosplenomegaly. Normoactive bowel sounds x 4 quadrants.  Rectal: Deferred.  Musculoskeletal: Symmetrical with no gross deformities. Skin:  Warm and dry. No rash or lesions on visible extremities. Extremities: + LE edema. Neurological: Alert oriented x 4, no focal deficits.  Psychological:  Alert and cooperative. Normal mood and affect.  ASSESSMENT AND PLAN:  181) 36 year old female with chronic diarrhea with intermittent urgency and generalized abdominal pain, more upper/mid than lower abdominal pain. Colonoscopy in 2015 and flex sig in 2018 showed hemorrhoids. No colitis.  -Cholestyramine 4gm packet mix in 4 oz of any clear liquid once daily not to be taken within 30 minutes of any  other medication -Dicyclomine 10 mg one po tid PRN -EGD and colonoscopy at [facility] benefits and risks discussed including risk with sedation, risk of bleeding, perforation and infection  -Our office will contact patient to schedule EGD/colonoscopy at [facility] (BMI > 50) -CBC, CMP, CRP, TTG and IGA  2) Rectal bleeding, intermittent. History of hemorrhoids.  -See plan in # 1  3) GERD, symptoms fairly well controlled on Famotidine  QD. EGD in 2015 and 2018 showed gastritis.  -See plan in # 1 -GERD diet  -Eventual referral to [facility]  4) Anxiety/depression  -Continue follow up with therapist and psychiatrist   5) Paroxysmal atrial fib x 1 episode, resolved after laparoscopic cholecystectomy 03/2017  Today's encounter was 60 minutes including extensive record/result review, pre-charting, obtaining history/exam, formulating a treatment, face to face counseling and documentation.        CC:  Carlean Jews, NP

## 2022-06-04 NOTE — Progress Notes (Signed)
   Phyllis Gentry [facility] [birth year] [id number]   History:  36 y.o. G0 presents for annual exam. 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 Previous diagnosis of PCOS, using provera prn to induce a withdrawal bleed. OCPs cause heart palpitations, did not do well on those. hgbA1c was checked at PCP and elevated to 6.0. Normal TSH level. Has had GI upset and diarrhea recently, has appt with GI as it started before starting the metformin. She does notice she is not as hungry since being on the twice a day dose. She has not lost any weight yet. Would like to continue current dose until she sees GI. Needs refill on provera.  Gynecologic History No LMP recorded.   Contraception/Family planning:  partner is a transgender man Sexually active: yes Last Pap: 2018. Results were: normal  Obstetric History OB History  Gravida Para Term Preterm AB Living  0 0 0 0 0 0  SAB IAB Ectopic Multiple Live Births  0 0 0 0 0     The following portions of the patient's history were reviewed and updated as appropriate: allergies, current medications, past family history, past medical history, past social history, past surgical history, and problem list.  Review of Systems Pertinent items noted in HPI and remainder of comprehensive ROS otherwise negative.   Past medical history, past surgical history, family history and social history were all reviewed and documented in the EPIC chart.   Exam:  Vitals:   06/04/22 1054  BP: 132/84  Weight: 298 lb (135.2 kg)   Body mass index is 51.15 kg/m.    Assessment/Plan:    1. PCOS (polycystic ovarian syndrome)  - medroxyPROGESTERone (PROVERA) 10 MG tablet; Take 1 tablet (10 mg total) by mouth daily.  Dispense: 10 tablet; Refill: 2  Take as needed every 3 months to induce withdrawal bleed  - metFORMIN (GLUCOPHAGE) 500 MG tablet; Take 1 tablet (500 mg total) by mouth 2 (two) times daily with a meal.  Dispense: 180 tablet; Refill: 3  2.  Prediabetes Will recheck hgbA1c with next annual if not done with PCP If GI sx improve will incease metformin dose  Christal Lagerstrom B WHNP-BC 11:05 AM 06/04/2022

## 2022-06-05 ENCOUNTER — Other Ambulatory Visit (INDEPENDENT_AMBULATORY_CARE_PROVIDER_SITE_OTHER): Payer: 59

## 2022-06-05 ENCOUNTER — Ambulatory Visit: Payer: 59 | Admitting: Nurse Practitioner

## 2022-06-05 ENCOUNTER — Encounter: Payer: Self-pay | Admitting: Nurse Practitioner

## 2022-06-05 VITALS — BP 130/82 | HR 86 | Ht 64.0 in | Wt 299.0 lb

## 2022-06-05 DIAGNOSIS — R197 Diarrhea, unspecified: Secondary | ICD-10-CM

## 2022-06-05 DIAGNOSIS — K219 Gastro-esophageal reflux disease without esophagitis: Secondary | ICD-10-CM

## 2022-06-05 DIAGNOSIS — R1084 Generalized abdominal pain: Secondary | ICD-10-CM

## 2022-06-05 LAB — CBC WITH DIFFERENTIAL/PLATELET
Basophils Absolute: 0.1 10*3/uL (ref 0.0–0.1)
Basophils Relative: 0.9 % (ref 0.0–3.0)
Eosinophils Absolute: 0.2 10*3/uL (ref 0.0–0.7)
Eosinophils Relative: 1.8 % (ref 0.0–5.0)
HCT: 38.1 % (ref 36.0–46.0)
Hemoglobin: 12.4 g/dL (ref 12.0–15.0)
Lymphocytes Relative: 29.3 % (ref 12.0–46.0)
Lymphs Abs: 2.6 10*3/uL (ref 0.7–4.0)
MCHC: 32.5 g/dL (ref 30.0–36.0)
MCV: 83.6 fl (ref 78.0–100.0)
Monocytes Absolute: 0.6 10*3/uL (ref 0.1–1.0)
Monocytes Relative: 6.7 % (ref 3.0–12.0)
Neutro Abs: 5.4 10*3/uL (ref 1.4–7.7)
Neutrophils Relative %: 61.3 % (ref 43.0–77.0)
Platelets: 433 10*3/uL — ABNORMAL HIGH (ref 150.0–400.0)
RBC: 4.55 Mil/uL (ref 3.87–5.11)
RDW: 15.1 % (ref 11.5–15.5)
WBC: 8.8 10*3/uL (ref 4.0–10.5)

## 2022-06-05 LAB — COMPREHENSIVE METABOLIC PANEL
ALT: 14 U/L (ref 0–35)
AST: 16 U/L (ref 0–37)
Albumin: 3.9 g/dL (ref 3.5–5.2)
Alkaline Phosphatase: 72 U/L (ref 39–117)
BUN: 10 mg/dL (ref 6–23)
CO2: 29 mEq/L (ref 19–32)
Calcium: 9.2 mg/dL (ref 8.4–10.5)
Chloride: 103 mEq/L (ref 96–112)
Creatinine, Ser: 0.66 mg/dL (ref 0.40–1.20)
GFR: 111.8 mL/min (ref >60.00)
Glucose, Bld: 104 mg/dL — ABNORMAL HIGH (ref 70–99)
Potassium: 3.8 mEq/L (ref 3.5–5.1)
Sodium: 138 mEq/L (ref 135–145)
Total Bilirubin: 0.3 mg/dL (ref 0.2–1.2)
Total Protein: 7.6 g/dL (ref 6.0–8.3)

## 2022-06-05 LAB — C-REACTIVE PROTEIN: CRP: 1.7 mg/dL (ref 0.5–20.0)

## 2022-06-05 MED ORDER — CHOLESTYRAMINE 4 G PO PACK: 4.0000 g | PACK | Freq: Every day | ORAL | 1 refills | Status: DC

## 2022-06-05 NOTE — Patient Instructions (Signed)
Your provider has requested that you go to the basement level for lab work before leaving today. Press "B" on the elevator. The lab is located at the first door on the left as you exit the elevator.  We have sent the following medications to your pharmacy for you to pick up at your convenience: Cholestyramine   Office will contact you to schedule EGD and Colonoscopy after we have received records from previous GI office.  If you are age 38 or older, your body mass index should be between 23-30. Your Body mass index is 51.32 kg/m. If this is out of the aforementioned range listed, please consider follow up with your Primary Care Provider.  If you are age 105 or younger, your body mass index should be between 19-25. Your Body mass index is 51.32 kg/m. If this is out of the aformentioned range listed, please consider follow up with your Primary Care Provider.   ________________________________________________________  The [facility] providers would like to encourage you to use [facility] to communicate with providers for non-urgent requests or questions.  Due to long hold times on the telephone, sending your provider a message by [facility] may be a faster and more efficient way to get a response.  Please allow 48 business hours for a response.  Please remember that this is for non-urgent requests.  _______________________________________________________  Thank you for choosing me and [facility].

## 2022-06-05 NOTE — Progress Notes (Signed)
Negative mammogram

## 2022-06-06 LAB — IGA: Immunoglobulin A: 313 mg/dL — ABNORMAL HIGH (ref 47–310)

## 2022-06-06 LAB — TISSUE TRANSGLUTAMINASE ABS,IGG,IGA
(tTG) Ab, IgA: <1 U/mL
(tTG) Ab, IgG: <1 U/mL

## 2022-07-01 ENCOUNTER — Other Ambulatory Visit: Payer: Self-pay | Admitting: Nurse Practitioner

## 2022-07-01 DIAGNOSIS — I10 Essential (primary) hypertension: Secondary | ICD-10-CM

## 2022-07-01 DIAGNOSIS — I4891 Unspecified atrial fibrillation: Secondary | ICD-10-CM

## 2022-07-03 ENCOUNTER — Other Ambulatory Visit: Payer: Self-pay

## 2022-07-03 MED ORDER — COLESTIPOL HCL 1 G PO TABS: 1.0000 g | ORAL_TABLET | Freq: Every day | ORAL | 1 refills | Status: DC

## 2022-07-05 ENCOUNTER — Telehealth: Payer: Self-pay

## 2022-07-05 NOTE — Telephone Encounter (Signed)
Left a message to return call, Dr Elana Alm next hospital day is 09-04-2022.

## 2022-07-05 NOTE — Telephone Encounter (Signed)
-----   Message from Arnaldo Natal, NP sent at 07/05/2022  2:04 PM EDT ----- Regarding: FW: colon/endo Sheralyn Boatman, looks like we received her 30 and 2018 records as these reports are in my office note 6/14. Pls coordinate with Dr. Lavon Paganini (supervising MD day of office visit) to schedule EGD and colonoscopy at [facility]. THX  I will be out of the office next week. Pls verify if she is taking Cholestyramine for her diarrhea. If the Famotidine is not working for her GERD then switch her to pantoprazole 40mg  one po QD with caution PPI might worsen her diarrhea. Pantoprazole 40mg  one po QD # 30, 2 refills. Thx   ----- Message ----- From: , CMA Sent: 07/05/2022  10:12 AM EDT To: Alexis Frock, NP Subject: colon/endo                                     Please see the message from this patient in regards to scheduling procedures.  I asked her to call her former GI doctors and see if she can push them to get 07/07/2022 a copy of the records.  I cant find a copy of the records release any where in our office. I did see the same info on care-everywhere as you.

## 2022-07-15 ENCOUNTER — Other Ambulatory Visit: Payer: Self-pay | Admitting: Radiology

## 2022-07-15 DIAGNOSIS — R102 Pelvic and perineal pain: Secondary | ICD-10-CM

## 2022-07-15 DIAGNOSIS — E282 Polycystic ovarian syndrome: Secondary | ICD-10-CM

## 2022-07-15 MED ORDER — OZEMPIC (0.25 OR 0.5 MG/DOSE) 2 MG/1.5ML ~~LOC~~ SOPN: 0.2500 mg | PEN_INJECTOR | SUBCUTANEOUS | 1 refills | Status: DC

## 2022-07-15 NOTE — Telephone Encounter (Signed)
Sent a my chart message to the patient as she has not returned calls.

## 2022-07-25 ENCOUNTER — Other Ambulatory Visit: Payer: Self-pay | Admitting: Nurse Practitioner

## 2022-07-26 ENCOUNTER — Encounter: Payer: Self-pay | Admitting: Nurse Practitioner

## 2022-07-29 NOTE — Telephone Encounter (Signed)
Ok to schedule u/s for pelvic pain

## 2022-07-29 NOTE — Telephone Encounter (Signed)
Order placed, appointments please reach to schedule.

## 2022-07-30 ENCOUNTER — Other Ambulatory Visit: Payer: Self-pay | Admitting: Nurse Practitioner

## 2022-07-30 ENCOUNTER — Encounter: Payer: Self-pay | Admitting: Nurse Practitioner

## 2022-07-30 DIAGNOSIS — H66003 Acute suppurative otitis media without spontaneous rupture of ear drum, bilateral: Secondary | ICD-10-CM

## 2022-07-30 MED ORDER — OFLOXACIN 0.3 % OT SOLN: 5.0000 [drp] | Freq: Two times a day (BID) | OTIC | 0 refills | Status: DC

## 2022-07-30 MED ORDER — DOXYCYCLINE HYCLATE 100 MG PO TABS: 100.0000 mg | ORAL_TABLET | Freq: Two times a day (BID) | ORAL | 0 refills | Status: DC

## 2022-07-30 NOTE — Telephone Encounter (Signed)
Patient scheduled on 08/14/22

## 2022-08-11 ENCOUNTER — Other Ambulatory Visit: Payer: Self-pay | Admitting: Radiology

## 2022-08-11 DIAGNOSIS — E282 Polycystic ovarian syndrome: Secondary | ICD-10-CM

## 2022-08-14 ENCOUNTER — Ambulatory Visit (INDEPENDENT_AMBULATORY_CARE_PROVIDER_SITE_OTHER): Payer: Commercial Managed Care - HMO

## 2022-08-14 ENCOUNTER — Ambulatory Visit (INDEPENDENT_AMBULATORY_CARE_PROVIDER_SITE_OTHER): Payer: Commercial Managed Care - HMO | Admitting: Radiology

## 2022-08-14 ENCOUNTER — Encounter: Payer: Self-pay | Admitting: Radiology

## 2022-08-14 VITALS — BP 124/84

## 2022-08-14 DIAGNOSIS — R102 Pelvic and perineal pain: Secondary | ICD-10-CM

## 2022-08-14 DIAGNOSIS — R1032 Left lower quadrant pain: Secondary | ICD-10-CM | POA: Diagnosis not present

## 2022-08-14 DIAGNOSIS — E282 Polycystic ovarian syndrome: Secondary | ICD-10-CM | POA: Diagnosis not present

## 2022-08-14 DIAGNOSIS — N840 Polyp of corpus uteri: Secondary | ICD-10-CM | POA: Diagnosis not present

## 2022-08-14 NOTE — Telephone Encounter (Signed)
Last aex was 03-01-22 & last office visit was 6/23. Please approve of deny rx as appropriate

## 2022-08-14 NOTE — Progress Notes (Signed)
   Phyllis Gentry [facility] 14-Aug-1986 [id number]   History:  36 y.o. G0 presents for u/s to evaluate LLQ pain. HX of PCOS, currently cycle day 65. Has not taken provera since June. Being worked up by GI for LLQ pain as well.  Gynecologic History  Sexually active: yes Last Pap: 02/2022.   Obstetric History OB History  Gravida Para Term Preterm AB Living  0 0 0 0 0 0  SAB IAB Ectopic Multiple Live Births  0 0 0 0 0     The following portions of the patient's history were reviewed and updated as appropriate: allergies, current medications, past family history, past medical history, past social history, past surgical history, and problem list.  Review of Systems Pertinent items noted in HPI and remainder of comprehensive ROS otherwise negative.   Past medical history, past surgical history, family history and social history were all reviewed and documented in the EPIC chart.   Exam:  Vitals:   08/14/22 1518  BP: 124/84   There is no height or weight on file to calculate BMI.  Indications: LLQ pain   Vaginal ultrasound   Retroverted uterus 7.46 x 4.22 x 3.35cm Normal size and shape with no myometrial masses   Asymmetrical endometrium 7.70mm 10x70mm endometrial mass near the fundus with a feeder vessel anteriorly   Both ovaries normal size with normal perfusion Multiple small follicles bilaterally    No adnexal masses  No free fluid   Impression: PCOS, endometrial polyp  Assessment/Plan:   1. Endometrial polyp Discussed the importance of of SHG and EMB with patient. At increased risk for endometrial cancer with high BMI and PCOS, - [place] Sonohysterogram; Future  2. PCOS (polycystic ovarian syndrome) LMP 06/12/22- begin provera for withdrawal bleed  3. LLQ pain Reassured no cause for LLQ pain found on u/s. Has appt with GI scheduled for further work up    [name] [facility] 3:39 PM 08/14/2022

## 2022-08-15 ENCOUNTER — Encounter (HOSPITAL_BASED_OUTPATIENT_CLINIC_OR_DEPARTMENT_OTHER): Payer: Self-pay

## 2022-08-16 NOTE — Telephone Encounter (Signed)
Called patient and scheduled her for 8/30 with Gillian Shields, NP

## 2022-08-21 ENCOUNTER — Encounter (HOSPITAL_BASED_OUTPATIENT_CLINIC_OR_DEPARTMENT_OTHER): Payer: Self-pay | Admitting: Family

## 2022-08-21 ENCOUNTER — Ambulatory Visit (HOSPITAL_BASED_OUTPATIENT_CLINIC_OR_DEPARTMENT_OTHER): Payer: Commercial Managed Care - HMO | Admitting: Family

## 2022-08-21 VITALS — BP 124/69 | HR 85 | Ht 64.0 in | Wt 298.0 lb

## 2022-08-21 DIAGNOSIS — I6522 Occlusion and stenosis of left carotid artery: Secondary | ICD-10-CM | POA: Diagnosis not present

## 2022-08-21 DIAGNOSIS — R002 Palpitations: Secondary | ICD-10-CM

## 2022-08-21 NOTE — Patient Instructions (Addendum)
Medication Instructions:  Continue your current medications.   *If you need a refill on your cardiac medications before your next appointment, please call your pharmacy*   Lab Work: None ordered today.   Testing/Procedures: Your physician has requested that you have a carotid duplex. This test is an ultrasound of the carotid arteries in your neck. It looks at blood flow through these arteries that supply the brain with blood. Allow one hour for this exam. There are no restrictions or special instructions.  Follow-Up: At [facility], you and your health needs are our priority.  As part of our continuing mission to provide you with exceptional heart care, we have created designated Provider Care Teams.  These Care Teams include your primary Cardiologist (physician) and Advanced Practice Providers (APPs -  Physician Assistants and Nurse Practitioners) who all work together to provide you with the care you need, when you need it.  We recommend signing up for the patient portal called "MyChart".  Sign up information is provided on this After Visit Summary.  MyChart is used to connect with patients for Virtual Visits (Telemedicine).  Patients are able to view lab/test results, encounter notes, upcoming appointments, etc.  Non-urgent messages can be sent to your provider as well.   To learn more about what you can do with MyChart, go to [email or web address].    Your next appointment:   01/2023 with Phyllis Gentry  Other Instructions  To prevent palpitations: Make sure you are adequately hydrated.  Avoid and/or limit caffeine containing beverages like soda or tea. Exercise regularly.  Manage stress well. Some over the counter medications can cause palpitations such as Benadryl, AdvilPM, TylenolPM. Regular Advil or Tylenol do not cause palpitations.        Orthostatic Hypotension Blood pressure is a measurement of how strongly, or weakly, your circulating blood is pressing against  the walls of your arteries. Orthostatic hypotension is a drop in blood pressure that can happen when you change positions, such as when you go from lying down to standing. Arteries are blood vessels that carry blood from your heart throughout your body. When blood pressure is too low, you may not get enough blood to your brain or to the rest of your organs. Orthostatic hypotension can cause light-headedness, sweating, rapid heartbeat, blurred vision, and fainting. These symptoms require further investigation into the cause. What are the causes? Orthostatic hypotension can be caused by many things, including: Sudden changes in posture, such as standing up quickly after you have been sitting or lying down. Loss of blood (anemia) or loss of body fluids (dehydration). Heart problems, neurologic problems, or hormone problems. Pregnancy. Aging. The risk for this condition increases as you get older. Severe infection (sepsis). Certain medicines, such as medicines for high blood pressure or medicines that make the body lose excess fluids (diuretics). What are the signs or symptoms? Symptoms of this condition may include: Weakness, light-headedness, or dizziness. Sweating. Blurred vision. Tiredness (fatigue). Rapid heartbeat. Fainting, in severe cases. How is this diagnosed? This condition is diagnosed based on: Your symptoms and medical history. Your blood pressure measurements. Your health care provider will check your blood pressure when you are: Lying down. Sitting. Standing. A blood pressure reading is recorded as two numbers, such as "120 over 80" (or 120/80). The first ("top") number is called the systolic pressure. It is a measure of the pressure in your arteries as your heart beats. The second ("bottom") number is called the diastolic pressure. It is a measure of  the pressure in your arteries when your heart relaxes between beats. Blood pressure is measured in a unit called mmHg. Healthy  blood pressure for most adults is 120/80 mmHg. Orthostatic hypotension is defined as a 20 mmHg drop in systolic pressure or a 10 mmHg drop in diastolic pressure within 3 minutes of standing. Other information or tests that may be used to diagnose orthostatic hypotension include: Your other vital signs, such as your heart rate and temperature. Blood tests. An electrocardiogram (ECG) or echocardiogram. A Holter monitor. This is a device you wear that records your heart rhythm continuously, usually for 24-48 hours. Tilt table test. For this test, you will be safely secured to a table that moves you from a lying position to an upright position. Your heart rhythm and blood pressure will be monitored during the test. How is this treated? This condition may be treated by: Changing your diet. This may involve eating more salt (sodium) or drinking more water. Changing the dosage of certain medicines you are taking that might be lowering your blood pressure. Correcting the underlying reason for the orthostatic hypotension. Wearing compression stockings. Taking medicines to raise your blood pressure. Avoiding actions that trigger symptoms. Follow these instructions at home: Medicines Take over-the-counter and prescription medicines only as told by your health care provider. Follow instructions from your health care provider about changing the dosage of your current medicines, if this applies. Do not stop or adjust any of your medicines on your own. Eating and drinking  Drink enough fluid to keep your urine pale yellow. Eat extra salt only as directed. Do not add extra salt to your diet unless advised by your health care provider. Eat frequent, small meals. Avoid standing up suddenly after eating. General instructions  Get up slowly from lying down or sitting positions. This gives your blood pressure a chance to adjust. Avoid hot showers and excessive heat as directed by your health care  provider. Engage in regular physical activity as directed by your health care provider. If you have compression stockings, wear them as told. Keep all follow-up visits. This is important. Contact a health care provider if: You have a fever for more than 2-3 days. You feel more thirsty than usual. You feel dizzy or weak. Get help right away if: You have chest pain. You have a fast or irregular heartbeat. You become sweaty or feel light-headed. You feel short of breath. You faint. You have any symptoms of a stroke. "BE FAST" is an easy way to remember the main warning signs of a stroke: B - Balance. Signs are dizziness, sudden trouble walking, or loss of balance. E - Eyes. Signs are trouble seeing or a sudden change in vision. F - Face. Signs are sudden weakness or numbness of the face, or the face or eyelid drooping on one side. A - Arms. Signs are weakness or numbness in an arm. This happens suddenly and usually on one side of the body. S - Speech. Signs are sudden trouble speaking, slurred speech, or trouble understanding what people say. T - Time. Time to call emergency services. Write down what time symptoms started. You have other signs of a stroke, such as: A sudden, severe headache with no known cause. Nausea or vomiting. Seizure. These symptoms may represent a serious problem that is an emergency. Do not wait to see if the symptoms will go away. Get medical help right away. Call your local emergency services (911 in the [place] Do not drive yourself to the  hospital. Summary Orthostatic hypotension is a sudden drop in blood pressure. It can cause light-headedness, sweating, rapid heartbeat, blurred vision, and fainting. Orthostatic hypotension can be diagnosed by having your blood pressure taken while lying down, sitting, and then standing. Treatment may involve changing your diet, wearing compression stockings, sitting up slowly, adjusting your medicines, or correcting the  underlying reason for the orthostatic hypotension. Get help right away if you have chest pain, a fast or irregular heartbeat, or symptoms of a stroke. This information is not intended to replace advice given to you by your health care provider. Make sure you discuss any questions you have with your health care provider. Document Revised: 02/22/2021 Document Reviewed: 02/22/2021 Elsevier Patient Education  2023 [employer].

## 2022-08-21 NOTE — Progress Notes (Unsigned)
Office Visit    Patient Name: Phyllis Gentry Date of Encounter: 08/21/2022  PCP:  Carlean Jews, NP   [facility]  Cardiologist:  Jodelle Red, MD  Advanced Practice Provider:  No care team member to display Electrophysiologist:  None      Chief Complaint    Phyllis Gentry is a 36 y.o. female presents today for lightheadedness   Past Medical History    Past Medical History:  Diagnosis Date   Anemia    Anxiety    Asthma    Depression    Fibromyalgia    GERD (gastroesophageal reflux disease)    Hypertension    IBS (irritable bowel syndrome)    Lymphedema    PCOS (polycystic ovarian syndrome)    Prediabetes    Past Surgical History:  Procedure Laterality Date   CHOLECYSTECTOMY     DILATION AND CURETTAGE OF UTERUS      Allergies  Allergies  Allergen Reactions   Aminophylline Anaphylaxis   Cefaclor Anaphylaxis   Cephalosporins Anaphylaxis   Diphenhydramine Anaphylaxis   Sulfa Antibiotics Anaphylaxis   Clarithromycin    Iodinated Contrast Media    Iodine     Other reaction(s): Unknown    History of Present Illness    Phyllis Gentry is a 36 y.o. female with a hx of atrial fibrillation, lymphedema, hypothyroidism, psoriatic arthritis, PCOS, asthma, s/p cholecystectomy last seen 02/15/22.  In 2018 had a 12 hour episode of atrial fibrillation in setting of thyroid abnormalities. Today shares with me she feels she had another episode of atrial fibrillation that lasts 2 minutes. Sensation of heart racing but self resolved. Discussed this could be SVT or just sinus tachycardia. SInce that times when she make position changes feels lightheaded. Does not wear compression stockings regularly and only drinks 40 oz water per day. No caffine intake. No near syncope nor syncope. Shortness of breath associated with the lightheadedness, but no consistent exertional dyspnea. Consistent with Propranolol twice per day at 8 am and  8 pm. Reports no shortness of breath nor dyspnea on exertion. Reports no chest pain, pressure, or tightness. No edema, orthopnea, PND. Reports no palpitations.   Orthostatic VS for the past 24 hrs (Last 3 readings):  BP- Lying Pulse- Lying BP- Sitting Pulse- Sitting BP- Standing at 0 minutes Pulse- Standing at 0 minutes  08/21/22 1452 114/73 87 124/69 83 116/75 90     EKGs/Labs/Other Studies Reviewed:   The following studies were reviewed today:   EKG:  EKG is ordered today.  The ekg ordered today demonstrates NSR 85 bpm with no acute ST/T wave changes.   Recent Labs: 01/28/2022: TSH 1.760 06/05/2022: ALT 14; BUN 10; Creatinine, Ser 0.66; Hemoglobin 12.4; Platelets 433.0; Potassium 3.8; Sodium 138  Recent Lipid Panel    Component Value Date/Time   CHOL 220 (H) 01/28/2022 0910   TRIG 134 01/28/2022 0910   HDL 43 01/28/2022 0910   LDLCALC 153 (H) 01/28/2022 0910   Home Medications   Current Meds  Medication Sig   albuterol (VENTOLIN HFA) 108 (90 Base) MCG/ACT inhaler Inhale 2 puffs into the lungs every 4 (four) hours as needed for wheezing or shortness of breath.   Cholecalciferol (VITAMIN D) 125 MCG (5000 UT) CAPS    Cyanocobalamin 1000 MCG SUBL PLACE 1 TABLET(S) EVERY DAY BY SUBLINGUAL ROUTE.   dicyclomine (BENTYL) 20 MG tablet Take 20 mg by mouth every 6 (six) hours as needed for spasms.   escitalopram (LEXAPRO)  10 MG tablet Take 1 tablet (10 mg total) by mouth daily.   famotidine (PEPCID) 10 MG tablet    fexofenadine (ALLEGRA) 180 MG tablet    folic acid (FOLVITE) 1 MG tablet Take 1 tablet (1 mg total) by mouth daily.   medroxyPROGESTERone (PROVERA) 10 MG tablet Take 1 tablet (10 mg total) by mouth daily.   Omega-3 Fatty Acids (FISH OIL) 1000 MG CAPS    propranolol (INDERAL) 40 MG tablet TAKE 1 TABLET BY MOUTH TWICE A DAY     Review of Systems      All other systems reviewed and are otherwise negative except as noted above.  Physical Exam    VS:  BP 124/69 (BP Location:  Right Arm, Patient Position: Sitting, Cuff Size: Large)   Pulse 85   Ht 5\' 4"  (1.626 m)   Wt 298 lb (135.2 kg)   SpO2 98%   BMI 51.15 kg/m  , BMI Body mass index is 51.15 kg/m.  Wt Readings from Last 3 Encounters:  08/21/22 298 lb (135.2 kg)  06/05/22 299 lb (135.6 kg)  06/04/22 298 lb (135.2 kg)     GEN: Well nourished, well developed, in no acute distress. HEENT: normal. Neck: Supple, no JVD, carotid bruits, or masses. Cardiac: RRR, no murmurs, rubs, or gallops. No clubbing, cyanosis, edema.  Radials/PT 2+ and equal bilaterally.  Respiratory:  Respirations regular and unlabored, clear to auscultation bilaterally. GI: Soft, nontender, nondistended. MS: No deformity or atrophy. Skin: Warm and dry, no rash. Neuro:  Strength and sensation are intact. Psych: Normal affect.  Assessment & Plan    PAF - Previous episode in setting of thyroid abnormalities. NO evidence recurrence. If recurs, would need to discuss OAC One two minute episode of palpitations since last seen more c/w SVT vs ST. Encouraged to increase hydration, contact 06/06/22 if recurs. Not frequent enough for ZIO.  Orthostatic hypotension - Education provided on orthostatic precautions: Stay well hydrated, eat regular meals, wear compression socks, with position changes slowly. Anticipate this is the cause of her lightheadedness. She will make lifestyle changes. If no improvement, could consider echo but was unremarkable just one year ago.   Lymphedema - continue compressions.  L carotid stenosis - Due for repeat duplex. Ordered today.   Obesity - Weight loss via diet and exercise encouraged. Discussed the impact being overweight would have on cardiovascular risk.          Disposition: Follow up  01/2023  with 02/2023, MD or APP.  Signed, Jodelle Red, NP 08/21/2022, 3:04 PM [facility]

## 2022-08-22 ENCOUNTER — Other Ambulatory Visit: Payer: Self-pay | Admitting: Nurse Practitioner

## 2022-08-26 ENCOUNTER — Other Ambulatory Visit: Payer: Self-pay | Admitting: Physician Assistant

## 2022-08-28 ENCOUNTER — Ambulatory Visit: Payer: Commercial Managed Care - HMO | Admitting: Nurse Practitioner

## 2022-08-28 NOTE — Progress Notes (Deleted)
Established patient visit   Patient: Phyllis Gentry   DOB: [birth year]   36 y.o. Female  MRN: [id number] Visit Date: 08/28/2022   No chief complaint on file.  Subjective    HPI  [place] up  -PCOS - was referred to GYN -mood - currently on lexapro 10 mg daily  -sees cardiology for palpitations.    Medications: Outpatient Medications Prior to Visit  Medication Sig   albuterol (VENTOLIN HFA) 108 (90 Base) MCG/ACT inhaler INHALE 2 PUFFS INTO THE LUNGS EVERY 4 HOURS AS NEEDED FOR WHEEZING OR SHORTNESS OF BREATH.   Cholecalciferol (VITAMIN D) 125 MCG (5000 UT) CAPS    colestipol (COLESTID) 1 g tablet TAKE 1 TABLET BY MOUTH EVERY DAY   Cyanocobalamin 1000 MCG SUBL PLACE 1 TABLET(S) EVERY DAY BY SUBLINGUAL ROUTE.   dicyclomine (BENTYL) 20 MG tablet Take 20 mg by mouth every 6 (six) hours as needed for spasms.   escitalopram (LEXAPRO) 10 MG tablet Take 1 tablet (10 mg total) by mouth daily.   famotidine (PEPCID) 10 MG tablet    fexofenadine (ALLEGRA) 180 MG tablet    folic acid (FOLVITE) 1 MG tablet Take 1 tablet (1 mg total) by mouth daily.   medroxyPROGESTERone (PROVERA) 10 MG tablet Take 1 tablet (10 mg total) by mouth daily.   Omega-3 Fatty Acids (FISH OIL) 1000 MG CAPS    propranolol (INDERAL) 40 MG tablet TAKE 1 TABLET BY MOUTH TWICE A DAY   No facility-administered medications prior to visit.    Review of Systems  {Labs (Optional):23779}   Objective    There were no vitals taken for this visit. BP Readings from Last 3 Encounters:  08/21/22 124/69  08/14/22 124/84  06/05/22 130/82    Wt Readings from Last 3 Encounters:  08/21/22 298 lb (135.2 kg)  06/05/22 299 lb (135.6 kg)  06/04/22 298 lb (135.2 kg)    Physical Exam  ***  No results found for any visits on 08/28/22.  Assessment & Plan     Problem List Items Addressed This Visit   None    No follow-ups on file.         Carlean Jews, NP  [facility] [phone]  (phone) [phone] (fax)  [facility]

## 2022-08-31 ENCOUNTER — Other Ambulatory Visit: Payer: Self-pay | Admitting: Nurse Practitioner

## 2022-08-31 DIAGNOSIS — D529 Folate deficiency anemia, unspecified: Secondary | ICD-10-CM

## 2022-09-02 ENCOUNTER — Telehealth (HOSPITAL_BASED_OUTPATIENT_CLINIC_OR_DEPARTMENT_OTHER): Payer: Self-pay | Admitting: Cardiology

## 2022-09-02 NOTE — Telephone Encounter (Signed)
Left message for patient to call and reschedule Carotid doppler app on 09/12/22 at 3:00 pm

## 2022-09-04 NOTE — Telephone Encounter (Signed)
Spoke with patient regarding new appointment date and time for the Carotid duplex (cancelled 09/12/22---tech not here)----Wednesday 09/18/22 at 3:00 pm---patient voiced her understanding

## 2022-09-12 ENCOUNTER — Encounter (HOSPITAL_BASED_OUTPATIENT_CLINIC_OR_DEPARTMENT_OTHER): Payer: Commercial Managed Care - HMO

## 2022-09-18 ENCOUNTER — Ambulatory Visit (INDEPENDENT_AMBULATORY_CARE_PROVIDER_SITE_OTHER): Payer: Commercial Managed Care - HMO

## 2022-09-18 DIAGNOSIS — I6522 Occlusion and stenosis of left carotid artery: Secondary | ICD-10-CM

## 2022-09-19 ENCOUNTER — Other Ambulatory Visit (HOSPITAL_BASED_OUTPATIENT_CLINIC_OR_DEPARTMENT_OTHER): Payer: Self-pay

## 2022-09-19 ENCOUNTER — Other Ambulatory Visit: Payer: Self-pay | Admitting: Nurse Practitioner

## 2022-09-19 DIAGNOSIS — F3341 Major depressive disorder, recurrent, in partial remission: Secondary | ICD-10-CM

## 2022-09-19 DIAGNOSIS — I4891 Unspecified atrial fibrillation: Secondary | ICD-10-CM

## 2022-09-19 DIAGNOSIS — I6522 Occlusion and stenosis of left carotid artery: Secondary | ICD-10-CM

## 2022-09-19 DIAGNOSIS — I10 Essential (primary) hypertension: Secondary | ICD-10-CM

## 2022-09-19 NOTE — Progress Notes (Signed)
Repeat testing per Laurann Montana, NP

## 2022-09-20 ENCOUNTER — Encounter (HOSPITAL_BASED_OUTPATIENT_CLINIC_OR_DEPARTMENT_OTHER): Payer: Self-pay

## 2022-09-20 ENCOUNTER — Other Ambulatory Visit: Payer: Self-pay | Admitting: Nurse Practitioner

## 2022-09-20 ENCOUNTER — Telehealth (HOSPITAL_BASED_OUTPATIENT_CLINIC_OR_DEPARTMENT_OTHER): Payer: Self-pay

## 2022-09-20 DIAGNOSIS — I6522 Occlusion and stenosis of left carotid artery: Secondary | ICD-10-CM

## 2022-09-20 DIAGNOSIS — E782 Mixed hyperlipidemia: Secondary | ICD-10-CM

## 2022-09-20 NOTE — Telephone Encounter (Addendum)
Called results to patient and left results on VM (ok per DPR), instructions left to call office back if patient has any questions!      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 Recommend repeat duplex in one year for monitoring.

## 2022-09-20 NOTE — Telephone Encounter (Signed)
Please advise 

## 2022-09-23 MED ORDER — ROSUVASTATIN CALCIUM 20 MG PO TABS: 20.0000 mg | ORAL_TABLET | Freq: Every day | ORAL | 3 refills | Status: DC

## 2022-09-23 NOTE — Telephone Encounter (Signed)
What would you like me to send in for her?

## 2022-09-24 ENCOUNTER — Other Ambulatory Visit: Payer: Self-pay | Admitting: Nurse Practitioner

## 2022-09-24 ENCOUNTER — Other Ambulatory Visit: Payer: Commercial Managed Care - HMO

## 2022-09-24 ENCOUNTER — Other Ambulatory Visit: Payer: Commercial Managed Care - HMO | Admitting: Obstetrics & Gynecology

## 2022-09-24 NOTE — Progress Notes (Signed)
Established patient visit   Patient: Phyllis Gentry   DOB: 09-Jan-1986   36 y.o. Female  MRN: [id number] Visit Date: 09/25/2022   Chief Complaint  Patient presents with   Follow-up   Subjective    HPI  Follow up mood  -currently taking lexapro 10 mg daily.  -sees cardiology  -now seeing GYN provider.  -has noted a pimple under her right arm. It ruptured on it's own. Now is very tender. Has a "hole."  -having headache and muscle pain now .   Medications: Outpatient Medications Prior to Visit  Medication Sig   albuterol (VENTOLIN HFA) 108 (90 Base) MCG/ACT inhaler INHALE 2 PUFFS BY MOUTH EVERY 4 HOURS AS NEEDED FOR WHEEZE OR FOR SHORTNESS OF BREATH   Cholecalciferol (VITAMIN D) 125 MCG (5000 UT) CAPS    Cyanocobalamin 1000 MCG SUBL PLACE 1 TABLET(S) EVERY DAY BY SUBLINGUAL ROUTE.   dicyclomine (BENTYL) 20 MG tablet Take 20 mg by mouth every 6 (six) hours as needed for spasms.   escitalopram (LEXAPRO) 10 MG tablet TAKE 1 TABLET BY MOUTH EVERY DAY   famotidine (PEPCID) 10 MG tablet    fexofenadine (ALLEGRA) 818 MG tablet    folic acid (FOLVITE) 1 MG tablet TAKE 1 TABLET BY MOUTH EVERY DAY   medroxyPROGESTERone (PROVERA) 10 MG tablet Take 1 tablet (10 mg total) by mouth daily.   Omega-3 Fatty Acids (FISH OIL) 1000 MG CAPS    propranolol (INDERAL) 40 MG tablet Take 1 tablet (40 mg total) by mouth 2 (two) times daily. **paitent needs to be seen in office for additional refills.**   rosuvastatin (CRESTOR) 20 MG tablet Take 1 tablet (20 mg total) by mouth daily.   [DISCONTINUED] colestipol (COLESTID) 1 g tablet TAKE 1 TABLET BY MOUTH EVERY DAY   No facility-administered medications prior to visit.    Review of Systems  Constitutional:  Negative for activity change, appetite change, chills, fatigue and fever.  HENT:  Negative for congestion, postnasal drip, rhinorrhea, sinus pressure, sinus pain, sneezing and sore throat.   Eyes: Negative.   Respiratory:  Negative for cough,  chest tightness, shortness of breath and wheezing.   Cardiovascular:  Negative for chest pain and palpitations.  Gastrointestinal:  Negative for abdominal pain, constipation, diarrhea, nausea and vomiting.  Endocrine: Negative for cold intolerance, heat intolerance, polydipsia and polyuria.  Genitourinary:  Negative for dyspareunia, dysuria, flank pain, frequency and urgency.  Musculoskeletal:  Negative for arthralgias, back pain and myalgias.  Skin:  Negative for rash.       Pimple or infected hair in axilla. Has ruptured and is healing on it's own. Some redness and and tenderness is present  Allergic/Immunologic: Negative for environmental allergies.  Neurological:  Negative for dizziness, weakness and headaches.  Hematological:  Negative for adenopathy.  Psychiatric/Behavioral:  The patient is not nervous/anxious.        Objective     Today's Vitals   09/25/22 1451  BP: 116/79  Pulse: 78  SpO2: 96%  Weight: 297 lb 6.4 oz (134.9 kg)  Height: 5\' 4"  (1.626 m)   Body mass index is 51.05 kg/m.    BP Readings from Last 3 Encounters:  09/25/22 116/79  08/21/22 124/69  08/14/22 124/84    Wt Readings from Last 3 Encounters:  09/25/22 297 lb 6.4 oz (134.9 kg)  08/21/22 298 lb (135.2 kg)  06/05/22 299 lb (135.6 kg)    Physical Exam Vitals and nursing note reviewed.  Constitutional:      Appearance: Normal  appearance. She is well-developed. She is obese.  HENT:     Head: Normocephalic and atraumatic.     Nose: Nose normal.     Mouth/Throat:     Mouth: Mucous membranes are moist.     Pharynx: Oropharynx is clear.  Eyes:     Extraocular Movements: Extraocular movements intact.     Conjunctiva/sclera: Conjunctivae normal.     Pupils: Pupils are equal, round, and reactive to light.  Cardiovascular:     Rate and Rhythm: Normal rate and regular rhythm.     Pulses: Normal pulses.     Heart sounds: Normal heart sounds.  Pulmonary:     Effort: Pulmonary effort is normal.      Breath sounds: Normal breath sounds.  Abdominal:     Palpations: Abdomen is soft.  Musculoskeletal:        General: Normal range of motion.     Cervical back: Normal range of motion and neck supple.  Lymphadenopathy:     Cervical: No cervical adenopathy.  Skin:    General: Skin is warm and dry.     Capillary Refill: Capillary refill takes less than 2 seconds.     Findings: Lesion present.     Comments: Small, red, and tender lesion present under the right arm. Skin is intact and no drainage present at this time.   Neurological:     General: No focal deficit present.     Mental Status: She is alert and oriented to person, place, and time.  Psychiatric:        Mood and Affect: Mood normal.        Behavior: Behavior normal.        Thought Content: Thought content normal.        Judgment: Judgment normal.       Assessment & Plan    1. Cutaneous abscess of other site Start doxycycline 100 mg twice daily for 14 days. Keep area clean and dry. She understands to contact the office for new or worsening symptoms.  - doxycycline (VIBRA-TABS) 100 MG tablet; Take 1 tablet (100 mg total) by mouth 2 (two) times daily.  Dispense: 28 tablet; Refill: 0  2. BMI 50.0-59.9, adult (HCC) Discussed lowering calorie intake to 1500 calories per day and incorporating exercise into daily routine to help lose weight.   3. Recurrent major depressive disorder, in partial remission (HCC) Stable. Continue lexapro as prescribed.    Problem List Items Addressed This Visit   None Visit Diagnoses     Cutaneous abscess of other site    -  Primary   Relevant Medications   doxycycline (VIBRA-TABS) 100 MG tablet        Return in about 6 months (around 03/27/2023) for prn worsening or persistent symptoms.         Carlean Jews, NP  [facility] [phone] (phone) [phone] (fax)  [facility]

## 2022-09-25 ENCOUNTER — Ambulatory Visit (INDEPENDENT_AMBULATORY_CARE_PROVIDER_SITE_OTHER): Payer: Commercial Managed Care - HMO | Admitting: Nurse Practitioner

## 2022-09-25 ENCOUNTER — Encounter: Payer: Self-pay | Admitting: Nurse Practitioner

## 2022-09-25 VITALS — BP 116/79 | HR 78 | Ht 64.0 in | Wt 297.4 lb

## 2022-09-25 DIAGNOSIS — L02818 Cutaneous abscess of other sites: Secondary | ICD-10-CM

## 2022-09-25 DIAGNOSIS — F3341 Major depressive disorder, recurrent, in partial remission: Secondary | ICD-10-CM | POA: Diagnosis not present

## 2022-09-25 DIAGNOSIS — Z6841 Body Mass Index (BMI) 40.0 and over, adult: Secondary | ICD-10-CM | POA: Diagnosis not present

## 2022-09-25 MED ORDER — DOXYCYCLINE HYCLATE 100 MG PO TABS: 100.0000 mg | ORAL_TABLET | Freq: Two times a day (BID) | ORAL | 0 refills | Status: DC

## 2022-10-12 ENCOUNTER — Other Ambulatory Visit: Payer: Self-pay | Admitting: Nurse Practitioner

## 2022-10-12 DIAGNOSIS — I10 Essential (primary) hypertension: Secondary | ICD-10-CM

## 2022-10-12 DIAGNOSIS — I4891 Unspecified atrial fibrillation: Secondary | ICD-10-CM

## 2022-10-13 DIAGNOSIS — L0291 Cutaneous abscess, unspecified: Secondary | ICD-10-CM | POA: Insufficient documentation

## 2022-10-16 ENCOUNTER — Other Ambulatory Visit: Payer: Self-pay | Admitting: Nurse Practitioner

## 2022-10-17 ENCOUNTER — Other Ambulatory Visit: Payer: Self-pay | Admitting: Radiology

## 2022-10-17 ENCOUNTER — Encounter: Payer: Self-pay | Admitting: Obstetrics & Gynecology

## 2022-10-17 ENCOUNTER — Ambulatory Visit (INDEPENDENT_AMBULATORY_CARE_PROVIDER_SITE_OTHER): Payer: Commercial Managed Care - HMO

## 2022-10-17 ENCOUNTER — Other Ambulatory Visit (HOSPITAL_COMMUNITY)
Admission: RE | Admit: 2022-10-17 | Discharge: 2022-10-17 | Disposition: A | Discharge status: Home / Self Care | Payer: Commercial Managed Care - HMO | Source: Ambulatory Visit | Attending: Obstetrics & Gynecology | Admitting: Obstetrics & Gynecology

## 2022-10-17 ENCOUNTER — Ambulatory Visit (INDEPENDENT_AMBULATORY_CARE_PROVIDER_SITE_OTHER): Payer: Commercial Managed Care - HMO | Admitting: Obstetrics & Gynecology

## 2022-10-17 VITALS — BP 122/82 | HR 69

## 2022-10-17 DIAGNOSIS — R9389 Abnormal findings on diagnostic imaging of other specified body structures: Secondary | ICD-10-CM

## 2022-10-17 DIAGNOSIS — E282 Polycystic ovarian syndrome: Secondary | ICD-10-CM

## 2022-10-17 DIAGNOSIS — N840 Polyp of corpus uteri: Secondary | ICD-10-CM

## 2022-10-17 NOTE — Progress Notes (Signed)
    Phyllis Gentry [facility] 08/30/1986 [id number]        36 y.o.  G0  RP: Thickened endometrium/Polyp for Sonohysto/EBx  HPI: PCOS x many years with Oligo-metrorrhagia.  Pelvic US in 07/2022 showed a thickened endometrial line with a possible polyp.   OB History  Gravida Para Term Preterm AB Living  0 0 0 0 0 0  SAB IAB Ectopic Multiple Live Births  0 0 0 0 0    Past medical history,surgical history, problem list, medications, allergies, family history and social history were all reviewed and documented in the EPIC chart.   Directed ROS with pertinent positives and negatives documented in the history of present illness/assessment and plan.  Exam:  There were no vitals filed for this visit. General appearance:  Normal                                                                    Sono Infusion Hysterogram ( procedure note)   The initial transvaginal ultrasound demonstrated the following:  Comparison is made with previous scan on August 14, 2022.  T/V images.  Retroverted uterus with no myometrial mass.  The uterus is measured at 7.99 x 4.63 x 3.68 cm.  Irregular endometrial lining still present with suspicion of an intracavitary mass.  The endometrial lining is measured at 6.05 mm.  Both ovaries are unchanged since previous scan with PCOS appearance.  The speculum  was inserted and the cervix cleansed with Betadine solution after confirming that patient has no allergies.A small sonohysterography catheterwas utilized.  Insertion was facilitated with ring forceps, using a spear-like motion the catheter was inserted to the fundus of the uterus. The speculum is then removed carefully to avoid dislodging the catheter. The catheter was flushed with sterile saline delete prior to insertion to rid it of small amounts of air.the sterile saline solution was infused into the uterine cavity as a vaginal ultrasound probe was then placed in the vagina for full visualization of the uterine cavity  from a transvaginal approach. The following was noted:  Sonohysterogram with saline infusion revealing irregular walls but no intracavitary mass.  Combined wall thickness is measured at 5.1 mm.  The catheter was then removed after retrieving some of the saline from the intrauterine cavity. An endometrial biopsy was done with an endometrial biopsy Pipette using negative pressure on all endometrial surfaces.  Good specimen obtained.  Specimen sent to pathology. Patient tolerated procedure well.    Assessment/Plan:  36 y.o. G0P0000   1. Thickened endometrium Successful sonohysterogram with no intra uterine lesion.  Mildly thickened irregular walls for which an endometrial biopsy was done.  No complication.  Well-tolerated by patient.  Management per endometrial biopsy results. - Surgical pathology( [place]/ [facility])  2. PCOS (polycystic ovarian syndrome)  Taking cyclic Provera for withdrawal bleeding as needed.  Princess Bruins MD, 10:33 AM 10/17/2022

## 2022-10-21 ENCOUNTER — Encounter: Payer: Self-pay | Admitting: Obstetrics & Gynecology

## 2022-10-21 LAB — SURGICAL PATHOLOGY

## 2022-11-06 ENCOUNTER — Encounter: Payer: Self-pay | Admitting: Nurse Practitioner

## 2022-11-07 ENCOUNTER — Other Ambulatory Visit: Payer: Self-pay | Admitting: Nurse Practitioner

## 2022-11-12 ENCOUNTER — Encounter: Payer: Self-pay | Admitting: Nurse Practitioner

## 2022-11-12 ENCOUNTER — Other Ambulatory Visit: Payer: Self-pay | Admitting: Nurse Practitioner

## 2022-11-12 DIAGNOSIS — J4521 Mild intermittent asthma with (acute) exacerbation: Secondary | ICD-10-CM

## 2022-11-12 MED ORDER — ALBUTEROL SULFATE (2.5 MG/3ML) 0.083% IN NEBU: 2.5000 mg | INHALATION_SOLUTION | Freq: Four times a day (QID) | RESPIRATORY_TRACT | 1 refills | Status: DC | PRN

## 2022-12-04 DIAGNOSIS — F411 Generalized anxiety disorder: Secondary | ICD-10-CM | POA: Diagnosis not present

## 2022-12-04 DIAGNOSIS — F331 Major depressive disorder, recurrent, moderate: Secondary | ICD-10-CM | POA: Diagnosis not present

## 2022-12-18 DIAGNOSIS — F331 Major depressive disorder, recurrent, moderate: Secondary | ICD-10-CM | POA: Diagnosis not present

## 2022-12-18 DIAGNOSIS — F411 Generalized anxiety disorder: Secondary | ICD-10-CM | POA: Diagnosis not present

## 2022-12-19 DIAGNOSIS — F331 Major depressive disorder, recurrent, moderate: Secondary | ICD-10-CM | POA: Diagnosis not present

## 2022-12-19 DIAGNOSIS — F411 Generalized anxiety disorder: Secondary | ICD-10-CM | POA: Diagnosis not present

## 2022-12-20 ENCOUNTER — Encounter (HOSPITAL_BASED_OUTPATIENT_CLINIC_OR_DEPARTMENT_OTHER): Payer: Self-pay

## 2022-12-20 DIAGNOSIS — E782 Mixed hyperlipidemia: Secondary | ICD-10-CM

## 2022-12-20 DIAGNOSIS — I6522 Occlusion and stenosis of left carotid artery: Secondary | ICD-10-CM

## 2022-12-20 MED ORDER — ROSUVASTATIN CALCIUM 20 MG PO TABS: 20.0000 mg | ORAL_TABLET | Freq: Every day | ORAL | 3 refills | Status: DC

## 2022-12-25 DIAGNOSIS — F411 Generalized anxiety disorder: Secondary | ICD-10-CM | POA: Diagnosis not present

## 2022-12-25 DIAGNOSIS — F331 Major depressive disorder, recurrent, moderate: Secondary | ICD-10-CM | POA: Diagnosis not present

## 2023-01-01 DIAGNOSIS — F411 Generalized anxiety disorder: Secondary | ICD-10-CM | POA: Diagnosis not present

## 2023-01-01 DIAGNOSIS — F331 Major depressive disorder, recurrent, moderate: Secondary | ICD-10-CM | POA: Diagnosis not present

## 2023-01-07 ENCOUNTER — Ambulatory Visit: Payer: Commercial Managed Care - HMO | Admitting: Psychology

## 2023-01-08 DIAGNOSIS — F331 Major depressive disorder, recurrent, moderate: Secondary | ICD-10-CM | POA: Diagnosis not present

## 2023-01-08 DIAGNOSIS — F411 Generalized anxiety disorder: Secondary | ICD-10-CM | POA: Diagnosis not present

## 2023-01-09 DIAGNOSIS — F900 Attention-deficit hyperactivity disorder, predominantly inattentive type: Secondary | ICD-10-CM | POA: Diagnosis not present

## 2023-01-09 DIAGNOSIS — F331 Major depressive disorder, recurrent, moderate: Secondary | ICD-10-CM | POA: Diagnosis not present

## 2023-01-09 DIAGNOSIS — F411 Generalized anxiety disorder: Secondary | ICD-10-CM | POA: Diagnosis not present

## 2023-01-13 ENCOUNTER — Encounter: Payer: Self-pay | Admitting: Nurse Practitioner

## 2023-01-15 DIAGNOSIS — F331 Major depressive disorder, recurrent, moderate: Secondary | ICD-10-CM | POA: Diagnosis not present

## 2023-01-15 DIAGNOSIS — F411 Generalized anxiety disorder: Secondary | ICD-10-CM | POA: Diagnosis not present

## 2023-01-21 ENCOUNTER — Ambulatory Visit: Payer: Commercial Managed Care - HMO | Admitting: Psychology

## 2023-01-23 ENCOUNTER — Ambulatory Visit: Payer: Commercial Managed Care - HMO | Admitting: Psychology

## 2023-01-23 DIAGNOSIS — F331 Major depressive disorder, recurrent, moderate: Secondary | ICD-10-CM | POA: Diagnosis not present

## 2023-01-23 DIAGNOSIS — F411 Generalized anxiety disorder: Secondary | ICD-10-CM | POA: Diagnosis not present

## 2023-01-23 DIAGNOSIS — F900 Attention-deficit hyperactivity disorder, predominantly inattentive type: Secondary | ICD-10-CM | POA: Diagnosis not present

## 2023-01-29 DIAGNOSIS — F331 Major depressive disorder, recurrent, moderate: Secondary | ICD-10-CM | POA: Diagnosis not present

## 2023-01-29 DIAGNOSIS — F411 Generalized anxiety disorder: Secondary | ICD-10-CM | POA: Diagnosis not present

## 2023-01-30 ENCOUNTER — Other Ambulatory Visit: Payer: Self-pay

## 2023-01-30 ENCOUNTER — Emergency Department (HOSPITAL_COMMUNITY): Payer: Medicaid Other

## 2023-01-30 ENCOUNTER — Emergency Department (HOSPITAL_COMMUNITY)
Admission: EM | Admit: 2023-01-30 | Discharge: 2023-01-31 | Disposition: A | Discharge status: Home / Self Care | Payer: Medicaid Other | Attending: Emergency Medicine | Admitting: Emergency Medicine

## 2023-01-30 DIAGNOSIS — M79642 Pain in left hand: Secondary | ICD-10-CM | POA: Diagnosis not present

## 2023-01-30 DIAGNOSIS — E871 Hypo-osmolality and hyponatremia: Secondary | ICD-10-CM | POA: Insufficient documentation

## 2023-01-30 DIAGNOSIS — I10 Essential (primary) hypertension: Secondary | ICD-10-CM | POA: Insufficient documentation

## 2023-01-30 DIAGNOSIS — R112 Nausea with vomiting, unspecified: Secondary | ICD-10-CM | POA: Insufficient documentation

## 2023-01-30 DIAGNOSIS — R61 Generalized hyperhidrosis: Secondary | ICD-10-CM | POA: Insufficient documentation

## 2023-01-30 DIAGNOSIS — R531 Weakness: Secondary | ICD-10-CM | POA: Insufficient documentation

## 2023-01-30 DIAGNOSIS — R079 Chest pain, unspecified: Secondary | ICD-10-CM | POA: Diagnosis not present

## 2023-01-30 DIAGNOSIS — R42 Dizziness and giddiness: Secondary | ICD-10-CM | POA: Diagnosis not present

## 2023-01-30 DIAGNOSIS — Z7982 Long term (current) use of aspirin: Secondary | ICD-10-CM | POA: Insufficient documentation

## 2023-01-30 DIAGNOSIS — Z79899 Other long term (current) drug therapy: Secondary | ICD-10-CM | POA: Insufficient documentation

## 2023-01-30 DIAGNOSIS — R0789 Other chest pain: Secondary | ICD-10-CM | POA: Diagnosis not present

## 2023-01-30 DIAGNOSIS — R000 Tachycardia, unspecified: Secondary | ICD-10-CM | POA: Diagnosis not present

## 2023-01-30 LAB — CBC WITH DIFFERENTIAL/PLATELET
Abs Immature Granulocytes: 0.03 10*3/uL (ref 0.00–0.07)
Basophils Absolute: 0 10*3/uL (ref 0.0–0.1)
Basophils Relative: 0 %
Eosinophils Absolute: 0.1 10*3/uL (ref 0.0–0.5)
Eosinophils Relative: 1 %
HCT: 39.6 % (ref 36.0–46.0)
Hemoglobin: 12.4 g/dL (ref 12.0–15.0)
Immature Granulocytes: 0 %
Lymphocytes Relative: 22 %
Lymphs Abs: 2.2 10*3/uL (ref 0.7–4.0)
MCH: 26.7 pg (ref 26.0–34.0)
MCHC: 31.3 g/dL (ref 30.0–36.0)
MCV: 85.2 fL (ref 80.0–100.0)
Monocytes Absolute: 0.7 10*3/uL (ref 0.1–1.0)
Monocytes Relative: 7 %
Neutro Abs: 6.6 10*3/uL (ref 1.7–7.7)
Neutrophils Relative %: 70 %
Platelets: 395 10*3/uL (ref 150–400)
RBC: 4.65 MIL/uL (ref 3.87–5.11)
RDW: 13.5 % (ref 11.5–15.5)
WBC: 9.7 10*3/uL (ref 4.0–10.5)
nRBC: 0 % (ref 0.0–0.2)

## 2023-01-30 LAB — LIPASE, BLOOD: Lipase: 39 U/L (ref 11–51)

## 2023-01-30 LAB — TROPONIN I (HIGH SENSITIVITY): Troponin I (High Sensitivity): <2 ng/L (ref <18)

## 2023-01-30 LAB — COMPREHENSIVE METABOLIC PANEL
ALT: 17 U/L (ref 0–44)
AST: 21 U/L (ref 15–41)
Albumin: 3.5 g/dL (ref 3.5–5.0)
Alkaline Phosphatase: 72 U/L (ref 38–126)
Anion gap: 8 (ref 5–15)
BUN: 11 mg/dL (ref 6–20)
CO2: 26 mmol/L (ref 22–32)
Calcium: 9.1 mg/dL (ref 8.9–10.3)
Chloride: 100 mmol/L (ref 98–111)
Creatinine, Ser: 0.73 mg/dL (ref 0.44–1.00)
GFR, Estimated: >60 mL/min (ref >60)
Glucose, Bld: 154 mg/dL — ABNORMAL HIGH (ref 70–99)
Potassium: 3.6 mmol/L (ref 3.5–5.1)
Sodium: 134 mmol/L — ABNORMAL LOW (ref 135–145)
Total Bilirubin: 0.5 mg/dL (ref 0.3–1.2)
Total Protein: 7 g/dL (ref 6.5–8.1)

## 2023-01-30 NOTE — ED Notes (Addendum)
Attempted IV startx2. Unsuccessful.

## 2023-01-30 NOTE — ED Provider Notes (Signed)
[facility] Provider Note   CSN: [id number] Arrival date & time: 01/30/23  2222     History  Chief Complaint  Patient presents with   Dizziness    Zakirah Mathis is a 37 y.o. female who presents with concern for sudden onset left hand pain that radiated up the arm after using a vibrating facial massager.  Subsequently became lightheaded, states he became diaphoretic and felt very cold.  Took aspirin at home prior to EMS arrival with improvement in symptoms.  States that now her pain is 1 out of 10 in her chest, still has "very severe" left hand pain the patient is comfortable appearing.  States that she had nausea and vomiting x 1 with NBNB emesis.  Chest pain is nonexertional, did not radiate into the neck, no palpitations or shortness of breath.  I personally reviewed her medical record.  She had history of isolated incidence of atrial fibrillation in the past which spontaneously resolved.  She is not currently on medication for this.  History of fibromyalgia IBS, PCOS, hypertension, anxiety.  No anticoagulation.  HPI     Home Medications Prior to Admission medications   Medication Sig Start Date End Date Taking? Authorizing Provider  albuterol (PROVENTIL) (2.5 MG/3ML) 0.083% nebulizer solution Take 3 mLs (2.5 mg total) by nebulization every 6 (six) hours as needed for wheezing or shortness of breath. 11/12/22   Ronnell Freshwater, NP  albuterol (VENTOLIN HFA) 108 (90 Base) MCG/ACT inhaler INHALE 2 PUFFS BY MOUTH EVERY 4 HOURS AS NEEDED FOR WHEEZE OR FOR SHORTNESS OF BREATH 10/16/22   Ronnell Freshwater, NP  Cholecalciferol (VITAMIN D) 125 MCG (5000 UT) CAPS     [provider]  Cyanocobalamin 1000 MCG SUBL PLACE 1 TABLET(S) EVERY DAY BY SUBLINGUAL ROUTE. 11/29/19   [provider]  dicyclomine (BENTYL) 20 MG tablet Take 20 mg by mouth every 6 (six) hours as needed for spasms.    [provider]  DULoxetine  (CYMBALTA) 20 MG capsule Take 20 mg by mouth daily. 10/03/22   [provider]  famotidine (PEPCID) 10 MG tablet     [provider]  fexofenadine (ALLEGRA) 180 MG tablet     [provider]  folic acid (FOLVITE) 1 MG tablet TAKE 1 TABLET BY MOUTH EVERY DAY 09/02/22   Ronnell Freshwater, NP  hydrOXYzine (ATARAX) 25 MG tablet Take 25 mg by mouth as needed. 10/16/22   [provider]  medroxyPROGESTERone (PROVERA) 10 MG tablet Take 1 tablet (10 mg total) by mouth daily. Patient taking differently: Take 10 mg by mouth as needed. 06/04/22   Chrzanowski, Annitta Needs, NP  Omega-3 Fatty Acids (FISH OIL) 1000 MG CAPS     [provider]  propranolol (INDERAL) 40 MG tablet TAKE 1 TABLET (40 MG TOTAL) BY MOUTH 2 (TWO) TIMES DAILY. **PAITENT NEEDS TO BE SEEN IN OFFICE FOR ADDITIONAL REFILLS.** 10/14/22   Boscia, Greer Ee, NP  rosuvastatin (CRESTOR) 20 MG tablet Take 1 tablet (20 mg total) by mouth daily. 12/20/22 12/15/23  Buford Dresser, MD      Allergies    Aminophylline, Cefaclor, Cephalosporins, Diphenhydramine, Sulfa antibiotics, Clarithromycin, Iodinated contrast media, and Iodine    Review of Systems   Review of Systems  Constitutional:  Positive for fatigue.  Cardiovascular:  Positive for chest pain.  Gastrointestinal:  Positive for nausea and vomiting.  Musculoskeletal:        Left hand pain  Physical Exam Updated Vital Signs BP 105/63   Pulse 64   Temp 98.9 F (37.2 C) (Oral)   Resp 15   Ht 5' 4"$  (1.626 m)   Wt 136.1 kg   SpO2 96%   BMI 51.49 kg/m  Physical Exam Vitals and nursing note reviewed.  Constitutional:      Appearance: She is obese. She is not ill-appearing or toxic-appearing.  HENT:     Head: Normocephalic and atraumatic.     Mouth/Throat:     Mouth: Mucous membranes are moist.     Pharynx: No oropharyngeal exudate or posterior oropharyngeal erythema.  Eyes:     General:        Right eye: No discharge.         Left eye: No discharge.     Extraocular Movements: Extraocular movements intact.     Conjunctiva/sclera: Conjunctivae normal.     Pupils: Pupils are equal, round, and reactive to light.  Cardiovascular:     Rate and Rhythm: Normal rate and regular rhythm.     Pulses: Normal pulses.     Heart sounds: Normal heart sounds. No murmur heard. Pulmonary:     Effort: Pulmonary effort is normal. No respiratory distress.     Breath sounds: Normal breath sounds. No wheezing or rales.  Abdominal:     General: Bowel sounds are normal. There is no distension.     Palpations: Abdomen is soft.     Tenderness: There is no abdominal tenderness. There is no right CVA tenderness, guarding or rebound.  Musculoskeletal:        General: No deformity.     Right shoulder: Normal.     Left shoulder: Normal.     Right upper arm: Normal.     Left upper arm: Normal.     Right elbow: Normal.     Left elbow: Normal.     Right forearm: Normal.     Left forearm: Normal.     Right wrist: Normal.     Left wrist: Normal.     Right hand: Normal.     Left hand: Normal.     Cervical back: Neck supple.     Right lower leg: No edema.     Left lower leg: No edema.  Skin:    General: Skin is warm and dry.     Capillary Refill: Capillary refill takes less than 2 seconds.  Neurological:     General: No focal deficit present.     Mental Status: She is alert and oriented to person, place, and time. Mental status is at baseline.     GCS: GCS eye subscore is 4. GCS verbal subscore is 5. GCS motor subscore is 6.     Sensory: Sensation is intact.     Motor: Weakness present.     Coordination: Coordination is intact. Coordination normal. Finger-Nose-Finger Test normal. Rapid alternating movements normal.     Comments: Grip strength 3/5 on the left compared to 5/5 on the right TTP over the medial epicondyle on the left. Normal vascular status in the hands bilaterally. Pain exacerbated with flexion and extension of the left  elbow.   Psychiatric:        Mood and Affect: Mood normal.     ED Results / Procedures / Treatments   Labs (all labs ordered are listed, but only abnormal results are displayed) Labs Reviewed  COMPREHENSIVE METABOLIC PANEL - Abnormal; Notable for the following components:      Result Value  Sodium 134 (*)    Glucose, Bld 154 (*)    All other components within normal limits  CBC WITH DIFFERENTIAL/PLATELET  LIPASE, BLOOD  TSH  I-STAT BETA HCG BLOOD, ED ([facility], [facility], [facility] ONLY)  TROPONIN I (HIGH SENSITIVITY)  TROPONIN I (HIGH SENSITIVITY)    EKG EKG Interpretation  Date/Time:  Thursday January 30 2023 22:59:09 EST Ventricular Rate:  81 PR Interval:  159 QRS Duration: 103 QT Interval:  388 QTC Calculation: 451 R Axis:   53 Text Interpretation: Sinus rhythm Abnormal R-wave progression, early transition Abnormal inferior Q waves Confirmed by Orpah Greek [phone]) on 01/31/2023 2:21:04 AM  Radiology DG Chest Portable 1 View  Result Date: 01/30/2023 CLINICAL DATA:  chest pain EXAM: PORTABLE CHEST 1 VIEW COMPARISON:  None Available. FINDINGS: The heart and mediastinal contours are within normal limits. No focal consolidation. No pulmonary edema. No pleural effusion. No pneumothorax. No acute osseous abnormality. IMPRESSION: No active disease. Electronically Signed   By: Iven Finn M.D.   On: 01/30/2023 23:30    Procedures Procedures    Medications Ordered in ED Medications - No data to display  ED Course/ Medical Decision Making/ A&P                             Medical Decision Making 37 year old female presents with concern for pain in the left hand that radiates up the arm and briefly into the chest.  Sitting up send vomiting secondary to severity of pain. Mildly tachypneic on intake vital signs otherwise normal.  Cardiopulmonary exam is normal, abdominal exam is benign.  Weakness in grip in the left hand with associated tenderness palpation of the hand without  skin changes such as erythema, induration, bruising, or deformity.  No evidence of trauma to the limb.  Vascularly intact, sensation intact.  Will proceed with chest pain workup for reported lightheadedness nausea and transient chest pain.  In regards to patient's wrist pain, DDx includes is limited to peripheral neuropathy/neuropathy, carpal tunnel,, tendinopathy ,pronator teres syndrome, CVA, demyelinating disease.  Clinically doubt CVA or demyelinating disease given patient's.  Symptom is pain with sudden onset no other neurologic findings on exam.  CV   Amount and/or Complexity of Data Reviewed Labs: ordered.    Details: CBCwithout leukocytosis no or anemia.  CMP with mild hyponatremia of 134, otherwise unremarkable.  Lipase is normal, patient is not pregnant, troponins negative x 2.  TSH is normal Radiology: ordered.    Details: Chest x-ray negative for acute cardiopulmonary disease  ECG/medicine tests:     Details: EKG as above with sinus rhythm, no  STEMI  Patient evaluated at the bedside with EDP Dr. Betsey Holiday. Favor peripheral etiology of this patient's hand pain. Normal vascular status in the limb, doubt ischemia.   Clinical picture not consistent with ACS or other emergent underlying cardiopulmonary disorder.  Suspect peripheral tendinopathy versus peripheral neuropathy as etiology for patient's hand pain.  Patient placed in removable wrist splint and given instructions to follow-up with orthopedics.  Follow-up with Dr. Sammuel Hines already.   Clinical concern for emergent underlying etiology for this patient's symptoms that would warrant further ED workup or inpatient management is exceedingly low.  Azyiah and her family voiced understanding of her medical evaluation and treatment plan. Each of their questions answered to their expressed satisfaction.  Return precautions were given.  Patient is well-appearing, stable, and was discharged in good condition.   This chart was dictated using  voice recognition software, Diplomatic Services operational officer. Despite the best efforts of this provider to proofread and correct errors, errors may still occur which can change documentation meaning.  Final Clinical Impression(s) / ED Diagnoses Final diagnoses:  Left hand pain    Rx / DC Orders ED Discharge Orders     None         Emeline Darling, PA-C 01/31/23 0401    Orpah Greek, MD 01/31/23 [phone]

## 2023-01-30 NOTE — ED Triage Notes (Signed)
Pt BIB [facility] from home. Sudden on set of L hand pain radiating up the arm. Pt became dizzy, cold/clammy and sweating. Pt took 324mg  Asprin before ems arrived, symptoms improved. On arrival to ED pt  C/o of chest being "sore" and nausea, vomited x1, jaw pain, and L hand pain. Denies SOB

## 2023-01-31 DIAGNOSIS — S6992XA Unspecified injury of left wrist, hand and finger(s), initial encounter: Secondary | ICD-10-CM | POA: Diagnosis not present

## 2023-01-31 DIAGNOSIS — S62115A Nondisplaced fracture of triquetrum [cuneiform] bone, left wrist, initial encounter for closed fracture: Secondary | ICD-10-CM | POA: Diagnosis not present

## 2023-01-31 DIAGNOSIS — M25532 Pain in left wrist: Secondary | ICD-10-CM | POA: Diagnosis not present

## 2023-01-31 LAB — I-STAT BETA HCG BLOOD, ED (MC, WL, AP ONLY): I-stat hCG, quantitative: <5 m[IU]/mL (ref <5)

## 2023-01-31 LAB — TSH: TSH: 3.053 u[IU]/mL (ref 0.350–4.500)

## 2023-01-31 LAB — TROPONIN I (HIGH SENSITIVITY): Troponin I (High Sensitivity): <2 ng/L (ref <18)

## 2023-01-31 NOTE — Progress Notes (Signed)
Orthopedic Tech Progress Note Patient Details:  Phyllis Gentry May 08, 1986 [id number]  Ortho Devices Type of Ortho Device: Velcro wrist splint Ortho Device/Splint Location: lue Ortho Device/Splint Interventions: Ordered, Application, Adjustment   Post Interventions Patient Tolerated: Well Instructions Provided: Care of device, Adjustment of device  Karolee Stamps 01/31/2023, 3:41 AM

## 2023-01-31 NOTE — ED Notes (Signed)
Ortho at the bedside, applying wrist splint.

## 2023-01-31 NOTE — Discharge Instructions (Addendum)
You were seen in the ER today for your left hand pain is where send chest pain.  Your blood work, EKG, chest x-ray are reassuring.  With exact cause of your chest pain remains unclear there does not appear to be any emergent problem at this time.  Additionally with your left hand pain suspect you may have some inflammation of one of the nerves or the tendons in your hand and your wrist.  You may use the provided wrist splint and follow-up with your orthopedist or the hand specialist listed below for ongoing evaluation.  Return to the ER if you develop any new severe symptoms.

## 2023-02-01 ENCOUNTER — Encounter (HOSPITAL_BASED_OUTPATIENT_CLINIC_OR_DEPARTMENT_OTHER): Payer: Self-pay | Admitting: Orthopaedic Surgery

## 2023-02-01 ENCOUNTER — Encounter: Payer: Self-pay | Admitting: Nurse Practitioner

## 2023-02-03 ENCOUNTER — Ambulatory Visit: Payer: Commercial Managed Care - HMO | Admitting: Psychology

## 2023-02-05 DIAGNOSIS — F331 Major depressive disorder, recurrent, moderate: Secondary | ICD-10-CM | POA: Diagnosis not present

## 2023-02-05 DIAGNOSIS — F411 Generalized anxiety disorder: Secondary | ICD-10-CM | POA: Diagnosis not present

## 2023-02-06 ENCOUNTER — Ambulatory Visit (INDEPENDENT_AMBULATORY_CARE_PROVIDER_SITE_OTHER): Payer: Medicaid Other | Admitting: Orthopaedic Surgery

## 2023-02-06 DIAGNOSIS — M25532 Pain in left wrist: Secondary | ICD-10-CM | POA: Diagnosis not present

## 2023-02-06 NOTE — Progress Notes (Signed)
Chief Complaint: Left wrist pain     History of Present Illness:    Phyllis Gentry is a 37 y.o. female presents today for follow-up of her left wrist pain after visit to the emergency room.  She states that 1 week prior the left wrist became swollen and painful atraumatically.  On further questioning she does have a distant history of what sounds like psoriatic arthritis although this was not a definitive diagnosis.  She was given a wrist brace and an x-ray was obtained at the urgent care.  She is here today for further assessment.  Overall things do feel better since the initial presentation    Surgical History:   None  PMH/PSH/Family History/Social History/Meds/Allergies:    Past Medical History:  Diagnosis Date   Anemia    Anxiety    Asthma    Depression    Fibromyalgia    GERD (gastroesophageal reflux disease)    Hypertension    IBS (irritable bowel syndrome)    Lymphedema    PCOS (polycystic ovarian syndrome)    Prediabetes    Past Surgical History:  Procedure Laterality Date   CHOLECYSTECTOMY     DILATION AND CURETTAGE OF UTERUS     Social History   Socioeconomic History   Marital status: Married    Spouse name: Not on file   Number of children: 0   Years of education: Not on file   Highest education level: Not on file  Occupational History   Occupation: Caregiver  Tobacco Use   Smoking status: Never   Smokeless tobacco: Never  Vaping Use   Vaping Use: Never used  Substance and Sexual Activity   Alcohol use: Not Currently   Drug use: Not Currently   Sexual activity: Yes    Partners: Male    Comment: partner is transgener to female  Other Topics Concern   Not on file  Social History Narrative   Not on file   Social Determinants of Health   Financial Resource Strain: Not on file  Food Insecurity: Not on file  Transportation Needs: Not on file  Physical Activity: Not on file  Stress: Not on file  Social  Connections: Not on file   Family History  Problem Relation Age of Onset   Dementia Mother    Breast cancer Mother        onset 59, 2nd 2016   Diabetes Mother    Heart disease Father    Diabetes Father    Irritable bowel syndrome Father    Breast cancer Maternal Aunt        onset 30   Breast cancer Other        maternal first cousin, onset 55   Colon cancer Maternal Great-grandfather    Esophageal cancer Neg Hx    Stomach cancer Neg Hx    Allergies  Allergen Reactions   Aminophylline Anaphylaxis   Cefaclor Anaphylaxis   Cephalosporins Anaphylaxis   Diphenhydramine Anaphylaxis   Sulfa Antibiotics Anaphylaxis   Clarithromycin    Iodinated Contrast Media    Iodine     Other reaction(s): Unknown   Current Outpatient Medications  Medication Sig Dispense Refill   albuterol (PROVENTIL) (2.5 MG/3ML) 0.083% nebulizer solution Take 3 mLs (2.5 mg total) by nebulization every 6 (six) hours as needed for wheezing or shortness  of breath. 150 mL 1   albuterol (VENTOLIN HFA) 108 (90 Base) MCG/ACT inhaler INHALE 2 PUFFS BY MOUTH EVERY 4 HOURS AS NEEDED FOR WHEEZE OR FOR SHORTNESS OF BREATH 8.5 each 0   Cholecalciferol (VITAMIN D) 125 MCG (5000 UT) CAPS      Cyanocobalamin 1000 MCG SUBL PLACE 1 TABLET(S) EVERY DAY BY SUBLINGUAL ROUTE.     dicyclomine (BENTYL) 20 MG tablet Take 20 mg by mouth every 6 (six) hours as needed for spasms.     DULoxetine (CYMBALTA) 20 MG capsule Take 20 mg by mouth daily.     famotidine (PEPCID) 10 MG tablet      fexofenadine (ALLEGRA) 180 MG tablet      folic acid (FOLVITE) 1 MG tablet TAKE 1 TABLET BY MOUTH EVERY DAY 90 tablet 1   hydrOXYzine (ATARAX) 25 MG tablet Take 25 mg by mouth as needed.     medroxyPROGESTERone (PROVERA) 10 MG tablet Take 1 tablet (10 mg total) by mouth daily. (Patient taking differently: Take 10 mg by mouth as needed.) 10 tablet 2   Omega-3 Fatty Acids (FISH OIL) 1000 MG CAPS      propranolol (INDERAL) 40 MG tablet TAKE 1 TABLET (40 MG  TOTAL) BY MOUTH 2 (TWO) TIMES DAILY. **PAITENT NEEDS TO BE SEEN IN OFFICE FOR ADDITIONAL REFILLS.** 180 tablet 1   rosuvastatin (CRESTOR) 20 MG tablet Take 1 tablet (20 mg total) by mouth daily. 90 tablet 3   No current facility-administered medications for this visit.   No results found.  Review of Systems:   A ROS was performed including pertinent positives and negatives as documented in the HPI.  Physical Exam :   Constitutional: NAD and appears stated age Neurological: Alert and oriented Psych: Appropriate affect and cooperative There were no vitals taken for this visit.   Comprehensive Musculoskeletal Exam:    Tenderness and swelling about the left wrist.  She is having some tightness and tenderness with 20 degrees of wrist extension.  Full wrist flexion without pain.  Full composite fist.  Imaging:   Xray (3 views left wrist): Normal   I personally reviewed and interpreted the radiographs.   Assessment:   37 y.o. female with left wrist pain today which I do believe may be consistent with some type of inflammatory arthropathy.  To that effect I would like to plan to refer her to rheumatology for workup of this.  I did discuss possible role of treatment with steroid and Mobic although she would like to defer today as she is feeling better.  She will wean out of her wrist splint.  Plan :    -Return to clinic as needed     I personally saw and evaluated the patient, and participated in the management and treatment plan.  Vanetta Mulders, MD Attending Physician, Orthopedic Surgery  This document was dictated using Dragon voice recognition software. A reasonable attempt at proof reading has been made to minimize errors.

## 2023-02-12 DIAGNOSIS — F331 Major depressive disorder, recurrent, moderate: Secondary | ICD-10-CM | POA: Diagnosis not present

## 2023-02-12 DIAGNOSIS — F411 Generalized anxiety disorder: Secondary | ICD-10-CM | POA: Diagnosis not present

## 2023-02-13 ENCOUNTER — Ambulatory Visit: Payer: Medicaid Other | Admitting: Radiology

## 2023-02-19 DIAGNOSIS — F411 Generalized anxiety disorder: Secondary | ICD-10-CM | POA: Diagnosis not present

## 2023-02-19 DIAGNOSIS — F331 Major depressive disorder, recurrent, moderate: Secondary | ICD-10-CM | POA: Diagnosis not present

## 2023-02-20 ENCOUNTER — Encounter (HOSPITAL_BASED_OUTPATIENT_CLINIC_OR_DEPARTMENT_OTHER): Payer: Self-pay | Admitting: Cardiology

## 2023-02-20 ENCOUNTER — Ambulatory Visit (INDEPENDENT_AMBULATORY_CARE_PROVIDER_SITE_OTHER): Payer: Medicaid Other | Admitting: Cardiology

## 2023-02-20 VITALS — BP 132/82 | HR 81 | Ht 64.0 in | Wt 300.2 lb

## 2023-02-20 DIAGNOSIS — I890 Lymphedema, not elsewhere classified: Secondary | ICD-10-CM | POA: Diagnosis not present

## 2023-02-20 DIAGNOSIS — R0789 Other chest pain: Secondary | ICD-10-CM

## 2023-02-20 DIAGNOSIS — I6522 Occlusion and stenosis of left carotid artery: Secondary | ICD-10-CM | POA: Diagnosis not present

## 2023-02-20 DIAGNOSIS — R030 Elevated blood-pressure reading, without diagnosis of hypertension: Secondary | ICD-10-CM

## 2023-02-20 DIAGNOSIS — E782 Mixed hyperlipidemia: Secondary | ICD-10-CM

## 2023-02-20 DIAGNOSIS — Z6841 Body Mass Index (BMI) 40.0 and over, adult: Secondary | ICD-10-CM

## 2023-02-20 DIAGNOSIS — E8881 Metabolic syndrome: Secondary | ICD-10-CM | POA: Diagnosis not present

## 2023-02-20 DIAGNOSIS — Z8679 Personal history of other diseases of the circulatory system: Secondary | ICD-10-CM

## 2023-02-20 NOTE — Progress Notes (Signed)
Cardiology Office Note:    Date:  02/20/2023   ID:  Phyllis Gentry, DOB Jun 15, 1986, MRN [id number]  PCP:  Ronnell Freshwater, NP  Cardiologist:  Buford Dresser, MD  Referring MD: Ronnell Freshwater, NP   CC: Follow-up  History of Present Illness:    Phyllis Gentry is a 37 y.o. female with a hx of atrial fibrillation, lymphedema, hypothyroidism, psoriatic arthritis, asthma, and is s/p cholecystectomy, who is seen for follow-up. She was initially seen 02/15/2022 as a new consult at the request of Ronnell Freshwater, NP for the evaluation and management of atrial fibrillation.  Cardiovascular risk factors: Prior clinical ASCVD: Atrial fibrillation diagnosed in 2018 with concurrent thyroid disease. She was on anticoagulation for a short time but developed adverse side effects. Sometimes she feels like she may be in an episode of Afib, but has been unable to capture this on monitors. She also owns an Apple watch. Comorbid conditions: Lymphedema, worsened 2 years ago. Has improved in the last couple months. Uses a pump at home occasionally. She endorses prediabetes which also runs in her family. In the mornings her sugars are often in the 110s. Metabolic syndrome/Obesity: current weight 295, BMI 50 Chronic inflammatory conditions: Psoriatic arthritis diagnosis in 2020, she is not completely certain of this. Tobacco use history: None. Family history: Her mother has hashimoto's thyroiditis. Her father died very young with blood clots. 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 She endorses having this for years, stable. Calcium score of 0.  She followed up with Laurann Montana, NP 08/21/2022 and complained of lightheadedness. It was suspected her symptoms were related to orthostatic hypotension. She planned to work on lifestyle changes. Her EKG showed NSR at 85 bpm with no acute  ST/T wave changes. Repeat carotid dopplers 08/2022 revealed a 40-59% stenosis of the left ICA.  Today, she reports that on 01/30/2023 she thought she was having a heart attack. Initially this began as very sudden severe left hand/arm pain, which then progressed by radiating proximally. At this point she became more dizzy, nauseous, and her heart was pounding. Eventually she vomited and EMS was called. Her husband had given her four 81 mg ASA with some relief. She notes her BP was elevated when EMS arrived, but not dangerously elevated. After evaluation at the ED she was told her episode may have been caused by a rheumatic response. Of note, this is the second time having an episode like this. The first time, she was in Afib for 12 hours and it was thought to be a vagus nerve response. Her episode has not recurred since her ED visit. Currently she appears well.  Infrequently she may have an episode of chest pain that she feels is more musculoskeletal in nature. It has been a few years since she has noticed significant LE edema.  She denies any shortness of breath, headaches, syncope, orthopnea, or PND.   Past Medical History:  Diagnosis Date   Anemia    Anxiety    Asthma    Depression    Fibromyalgia    GERD (gastroesophageal reflux disease)    Hypertension    IBS (irritable bowel syndrome)    Lymphedema    PCOS (polycystic ovarian syndrome)    Prediabetes     Past Surgical History:  Procedure Laterality Date   CHOLECYSTECTOMY     DILATION AND CURETTAGE OF UTERUS  Current Medications: Current Outpatient Medications on File Prior to Visit  Medication Sig   albuterol (PROVENTIL) (2.5 MG/3ML) 0.083% nebulizer solution Take 3 mLs (2.5 mg total) by nebulization every 6 (six) hours as needed for wheezing or shortness of breath.   albuterol (VENTOLIN HFA) 108 (90 Base) MCG/ACT inhaler INHALE 2 PUFFS BY MOUTH EVERY 4 HOURS AS NEEDED FOR WHEEZE OR FOR SHORTNESS OF BREATH   Cholecalciferol  (VITAMIN D) 125 MCG (5000 UT) CAPS    Cyanocobalamin 1000 MCG SUBL PLACE 1 TABLET(S) EVERY DAY BY SUBLINGUAL ROUTE.   dicyclomine (BENTYL) 20 MG tablet Take 20 mg by mouth every 6 (six) hours as needed for spasms.   DULoxetine (CYMBALTA) 20 MG capsule Take 20 mg by mouth daily.   famotidine (PEPCID) 10 MG tablet    fexofenadine (ALLEGRA) 99991111 MG tablet    folic acid (FOLVITE) 1 MG tablet TAKE 1 TABLET BY MOUTH EVERY DAY   hydrOXYzine (ATARAX) 25 MG tablet Take 25 mg by mouth as needed.   medroxyPROGESTERone (PROVERA) 10 MG tablet Take 1 tablet (10 mg total) by mouth daily. (Patient taking differently: Take 10 mg by mouth as needed.)   Omega-3 Fatty Acids (FISH OIL) 1000 MG CAPS    propranolol (INDERAL) 40 MG tablet TAKE 1 TABLET (40 MG TOTAL) BY MOUTH 2 (TWO) TIMES DAILY. **PAITENT NEEDS TO BE SEEN IN OFFICE FOR ADDITIONAL REFILLS.**   rosuvastatin (CRESTOR) 20 MG tablet Take 1 tablet (20 mg total) by mouth daily.   No current facility-administered medications on file prior to visit.     Allergies:   Aminophylline, Cefaclor, Cephalosporins, Diphenhydramine, Sulfa antibiotics, Clarithromycin, Iodinated contrast media, and Iodine   Social History   Tobacco Use   Smoking status: Never   Smokeless tobacco: Never  Vaping Use   Vaping Use: Never used  Substance Use Topics   Alcohol use: Not Currently   Drug use: Not Currently    Family History: family history includes Breast cancer in her maternal aunt, mother, and another family member; Colon cancer in her maternal great-grandfather; Dementia in her mother; Diabetes in her father and mother; Heart disease in her father; Irritable bowel syndrome in her father. There is no history of Esophageal cancer or Stomach cancer.  ROS:   Please see the history of present illness. (+) Infrequent chest pain All other systems are reviewed and negative.    EKGs/Labs/Other Studies Reviewed:    The following studies were reviewed today:  Bilateral  Carotid Dopplers  09/18/2022: Summary:  Right Carotid: The extracranial vessels were near-normal with only minimal  wall thickening or plaque.   Left Carotid: Velocities in the left ICA are consistent with a 40-59%  stenosis.   Vertebrals: Bilateral vertebral arteries demonstrate antegrade flow.  Subclavians: Normal flow hemodynamics were seen in bilateral subclavian               arteries.   Care Everywhere: Multiple cardiac testing from [place] reviewed. Summary from note by Dr. Dinah Beers 04/23/21: CT coronary artery calcium score 01/15/21 at [facility] in [place]: Total calcium score of zero. No focal consolidation or pleural effusion. Exercise EKG stress test on 07/10/20: Six minutes and 17 seconds on the standard Bruce protocol, no ischemic ECG changes. Echocardiogram on 07/10/20: Normal LV function, EF 60% to 65%, trace AR, trace MR, trace PR, trace TR.  Carotid duplex scan on 07/10/20: No evidence of stenosis, bilaterally.  Zio patch event monitor from February 21, 2020, through February 24, 2020: Normal sinus rhythm with average heart rate of 81 beats per minute with isolated APCs rare, less than 1%, and isolated PVCs rare, less than 1%. Holter monitor on July 12, 2019: Normal sinus rhythm with average heart rate of 84 beats per minute with one isolated APC. Chest [facility] 11/08/20 at [facility]: No focal lobar consolidation or large pleural effusion.   Echo [id number] ([place]): Summary:   1. Left ventricle: The cavity size is normal. Normal thickness is present.     There are no regional wall motion abnormalities present. The left     ventricular ejection fraction is normal. The LVEF was determined by     visual estimate. Left ventricular ejection fraction is 55-60%. The left     ventricular filling pattern is normal. There is no evidence of diastolic     dysfunction present.  2. Right ventricle: The cavity size is normal. Right  ventricular systolic     function is normal.  3. Mitral valve: The leaflets are mildly thickened. There is mild (1+)     mitral valve regurgitation.  4. Aortic valve: The valve is trileaflet. There is no aortic stenosis. There     is no valvular aortic regurgitation.  5. Tricuspid valve: The valve is structurally normal. The tricuspid leaflets     are not thickened. There is mild (1+) tricuspid valve regurgitation.  6. Pericardium, extracardiac: There is no pericardial effusion.   Echo [id number] ([place]): Summary:   1. Left ventricle: The cavity size is normal. Normal thickness is present. There are no regional wall motion abnormalities     present. The left ventricular ejection fraction is normal. Left ventricular ejection fraction was 60-65%. The left ventricular     filling pattern is normal. There is no evidence of diastolic dysfunction present.  2. Right ventricle: The cavity size is normal. Right ventricular systolic function is normal.  3. Mitral valve: The leaflets are normal thickness. There is trace mitral valve regurgitation.  4. Aortic valve: The valve is trileaflet. The leaflets are normal thickness. There is no aortic stenosis. There is trace valvular     aortic regurgitation.  5. Tricuspid valve: The valve is structurally normal. The tricuspid leaflets are not thickened. There is trace tricuspid valve     regurgitation.  6. Pericardium, extracardiac: There is no pericardial effusion.   EKG:  EKG is personally reviewed.   02/20/2023:  EKG was not ordered. 02/15/2022: NSR at 85 bpm  Recent Labs: 01/30/2023: ALT 17; BUN 11; Creatinine, Ser 0.73; Hemoglobin 12.4; Platelets 395; Potassium 3.6; Sodium 134; TSH 3.053   Recent Lipid Panel    Component Value Date/Time   CHOL 220 (H) 01/28/2022 0910   TRIG 134 01/28/2022 0910   HDL 43 01/28/2022 0910   LDLCALC 153 (H) 01/28/2022 0910    Physical Exam:    VS:  BP 132/82 (BP Location: Left Arm, Patient  Position: Sitting, Cuff Size: Large)   Pulse 81   Ht '5\' 4"'$  (1.626 m)   Wt (!) 300 lb 3.2 oz (136.2 kg)   SpO2 97%   BMI 51.53 kg/m     Wt Readings from Last 3 Encounters:  02/20/23 (!) 300 lb 3.2 oz (136.2 kg)  01/30/23 300 lb (136.1 kg)  09/25/22 297 lb 6.4 oz (134.9 kg)    GEN: Well nourished, well developed in no acute distress HEENT: Normal, moist mucous membranes NECK: No JVD CARDIAC: regular rhythm, normal S1 and S2, no rubs or  gallops. No murmur. VASCULAR: Radial and DP pulses 2+ bilaterally. No carotid bruits RESPIRATORY:  Clear to auscultation without rales, wheezing or rhonchi  ABDOMEN: Soft, non-tender, non-distended MUSCULOSKELETAL:  Ambulates independently SKIN: Warm and dry, no significant LE edema today NEUROLOGIC:  Alert and oriented x 3. No focal neuro deficits noted. PSYCHIATRIC:  Normal affect    ASSESSMENT:    1. Atypical chest pain   2. Stenosis of left carotid artery   3. Personal history of atrial fibrillation   4. Lymphedema   5. Class 3 severe obesity due to excess calories without serious comorbidity with body mass index (BMI) of 50.0 to 59.9 in adult ([place])   6. Mixed hyperlipidemia   7. Elevated blood pressure reading   8. Metabolic syndrome     PLAN:    Recent event with L hand pain, radiating up L arm and with atypical chest pain -reviewed ER notes, workup. hsTn normal -seen by Dr. Sammuel Hines in orthopedics, thought to be inflammatory in nature. Pending evaluation by rheumatology -reviewed red flag warning signs that need immediate medical attention  Atrial fibrillation -in the setting of thyroid abnormalities -NSR today -no indication for anticoagulation at this time, but if recurs would need to discuss  Lymphedema -we discussed this at length. She already has lymphedema pump. Very well controlled currently  Class 3 severe obesity, BMI 50 Elevated blood pressure reading without hypertension Abnormal glucose handling L carotid plaque  with mild stenosis Hypercholesterolemia Consistent with metabolic syndrome -continue rosuvastatin 20 mg daily -elevated risk of future CV events in metabolic syndrome  History of palpitations: on propranolol  Elevated BP reading: monitor, on no antihypertensives.   Cardiac risk counseling and prevention recommendations: -recommend heart healthy/Mediterranean diet, with whole grains, fruits, vegetable, fish, lean meats, nuts, and olive oil. Limit salt. -recommend moderate walking, 3-5 times/week for 30-50 minutes each session. Aim for at least 150 minutes.week. Goal should be pace of 3 miles/hours, or walking 1.5 miles in 30 minutes -recommend avoidance of tobacco products. Avoid excess alcohol. -ASCVD risk score: The ASCVD Risk score (Arnett DK, et al., 2019) failed to calculate for the following reasons:   The 2019 ASCVD risk score is only valid for ages 68 to 46    Plan for follow up: 1 year or sooner as needed.  Buford Dresser, MD, PhD, [facility]    Medication Adjustments/Labs and Tests Ordered: Current medicines are reviewed at length with the patient today.  Concerns regarding medicines are outlined above.   No orders of the defined types were placed in this encounter.  No orders of the defined types were placed in this encounter.  Patient Instructions  Medication Instructions:  Your physician recommends that you continue on your current medications as directed. Please refer to the Current Medication list given to you today.  *If you need a refill on your cardiac medications before your next appointment, please call your pharmacy*  Lab Work: NONE  Testing/Procedures: NONE  Follow-Up: At [facility], you and your health needs are our priority.  As part of our continuing mission to provide you with exceptional heart care, we have created designated Provider Care Teams.  These Care Teams include your primary Cardiologist  (physician) and Advanced Practice Providers (APPs -  Physician Assistants and Nurse Practitioners) who all work together to provide you with the care you need, when you need it.  We recommend signing up for the patient portal called "MyChart".  Sign up information is provided  on this After Visit Summary.  MyChart is used to connect with patients for Virtual Visits (Telemedicine).  Patients are able to view lab/test results, encounter notes, upcoming appointments, etc.  Non-urgent messages can be sent to your provider as well.   To learn more about what you can do with MyChart, go to [email or web address].    Your next appointment:   12 month(s)  The format for your next appointment:   In Person  Provider:   Buford Dresser, MD       [facility],acting as a scribe for Buford Dresser, MD.,have documented all relevant documentation on the behalf of Buford Dresser, MD,as directed by  Buford Dresser, MD while in the presence of Buford Dresser, MD.  I, Buford Dresser, MD, have reviewed all documentation for this visit. The documentation on 02/20/23 for the exam, diagnosis, procedures, and orders are all accurate and complete.   Signed, Buford Dresser, MD PhD 02/20/2023     [place]

## 2023-02-20 NOTE — Patient Instructions (Signed)
Medication Instructions:  Your physician recommends that you continue on your current medications as directed. Please refer to the Current Medication list given to you today.  *If you need a refill on your cardiac medications before your next appointment, please call your pharmacy*  Lab Work: NONE  Testing/Procedures: NONE  Follow-Up: At [facility], you and your health needs are our priority.  As part of our continuing mission to provide you with exceptional heart care, we have created designated Provider Care Teams.  These Care Teams include your primary Cardiologist (physician) and Advanced Practice Providers (APPs -  Physician Assistants and Nurse Practitioners) who all work together to provide you with the care you need, when you need it.  We recommend signing up for the patient portal called "MyChart".  Sign up information is provided on this After Visit Summary.  MyChart is used to connect with patients for Virtual Visits (Telemedicine).  Patients are able to view lab/test results, encounter notes, upcoming appointments, etc.  Non-urgent messages can be sent to your provider as well.   To learn more about what you can do with MyChart, go to [email or web address].    Your next appointment:   12 month(s)  The format for your next appointment:   In Person  Provider:   Bridgette Christopher, MD     

## 2023-02-26 DIAGNOSIS — F331 Major depressive disorder, recurrent, moderate: Secondary | ICD-10-CM | POA: Diagnosis not present

## 2023-02-26 DIAGNOSIS — F411 Generalized anxiety disorder: Secondary | ICD-10-CM | POA: Diagnosis not present

## 2023-03-03 DIAGNOSIS — R0981 Nasal congestion: Secondary | ICD-10-CM | POA: Diagnosis not present

## 2023-03-03 DIAGNOSIS — U071 COVID-19: Secondary | ICD-10-CM | POA: Diagnosis not present

## 2023-03-03 DIAGNOSIS — R059 Cough, unspecified: Secondary | ICD-10-CM | POA: Diagnosis not present

## 2023-03-03 DIAGNOSIS — R509 Fever, unspecified: Secondary | ICD-10-CM | POA: Diagnosis not present

## 2023-03-04 ENCOUNTER — Encounter: Payer: Self-pay | Admitting: Nurse Practitioner

## 2023-03-04 DIAGNOSIS — J4521 Mild intermittent asthma with (acute) exacerbation: Secondary | ICD-10-CM

## 2023-03-04 MED ORDER — ALBUTEROL SULFATE (2.5 MG/3ML) 0.083% IN NEBU: 2.5000 mg | INHALATION_SOLUTION | Freq: Four times a day (QID) | RESPIRATORY_TRACT | 1 refills | Status: AC | PRN

## 2023-03-04 MED ORDER — ALBUTEROL SULFATE HFA 108 (90 BASE) MCG/ACT IN AERS: INHALATION_SPRAY | RESPIRATORY_TRACT | 0 refills | Status: DC

## 2023-03-11 ENCOUNTER — Ambulatory Visit: Payer: Medicaid Other | Admitting: Radiology

## 2023-03-12 DIAGNOSIS — F331 Major depressive disorder, recurrent, moderate: Secondary | ICD-10-CM | POA: Diagnosis not present

## 2023-03-12 DIAGNOSIS — F411 Generalized anxiety disorder: Secondary | ICD-10-CM | POA: Diagnosis not present

## 2023-03-19 DIAGNOSIS — F331 Major depressive disorder, recurrent, moderate: Secondary | ICD-10-CM | POA: Diagnosis not present

## 2023-03-19 DIAGNOSIS — F411 Generalized anxiety disorder: Secondary | ICD-10-CM | POA: Diagnosis not present

## 2023-03-20 ENCOUNTER — Other Ambulatory Visit: Payer: Self-pay | Admitting: Nurse Practitioner

## 2023-03-20 DIAGNOSIS — J4521 Mild intermittent asthma with (acute) exacerbation: Secondary | ICD-10-CM

## 2023-03-26 ENCOUNTER — Other Ambulatory Visit: Payer: Self-pay | Admitting: Nurse Practitioner

## 2023-03-26 DIAGNOSIS — J4521 Mild intermittent asthma with (acute) exacerbation: Secondary | ICD-10-CM

## 2023-03-26 DIAGNOSIS — F331 Major depressive disorder, recurrent, moderate: Secondary | ICD-10-CM | POA: Diagnosis not present

## 2023-03-26 DIAGNOSIS — F411 Generalized anxiety disorder: Secondary | ICD-10-CM | POA: Diagnosis not present

## 2023-03-27 ENCOUNTER — Ambulatory Visit: Payer: Medicaid Other | Admitting: Nurse Practitioner

## 2023-03-27 ENCOUNTER — Encounter: Payer: Self-pay | Admitting: Nurse Practitioner

## 2023-03-27 VITALS — BP 122/85 | HR 75 | Ht 64.0 in | Wt 299.1 lb

## 2023-03-27 DIAGNOSIS — E559 Vitamin D deficiency, unspecified: Secondary | ICD-10-CM

## 2023-03-27 DIAGNOSIS — D529 Folate deficiency anemia, unspecified: Secondary | ICD-10-CM

## 2023-03-27 DIAGNOSIS — R5383 Other fatigue: Secondary | ICD-10-CM

## 2023-03-27 DIAGNOSIS — N926 Irregular menstruation, unspecified: Secondary | ICD-10-CM

## 2023-03-27 DIAGNOSIS — R7303 Prediabetes: Secondary | ICD-10-CM | POA: Diagnosis not present

## 2023-03-27 DIAGNOSIS — E782 Mixed hyperlipidemia: Secondary | ICD-10-CM | POA: Diagnosis not present

## 2023-03-27 NOTE — Progress Notes (Signed)
Established patient visit   Patient: Phyllis Gentry   DOB: [birth year]   37 y.o. Female  MRN: [id number] Visit Date: 03/27/2023   Chief Complaint  Patient presents with   Medical Management of Chronic Issues   Subjective    HPI  Follow up  -had COVID about 4 weeks ago -having trouble with brain fog -having trouble with her menstrual cycle.  -will be seeing rheumatologist due to bone/joint pain which has been unexplained by osteoarthritis.  -moderate fatigue   Medications: Outpatient Medications Prior to Visit  Medication Sig   albuterol (PROVENTIL) (2.5 MG/3ML) 0.083% nebulizer solution Take 3 mLs (2.5 mg total) by nebulization every 6 (six) hours as needed for wheezing or shortness of breath.   Cholecalciferol (VITAMIN D) 125 MCG (5000 UT) CAPS    Cyanocobalamin 1000 MCG SUBL PLACE 1 TABLET(S) EVERY DAY BY SUBLINGUAL ROUTE.   dicyclomine (BENTYL) 20 MG tablet Take 20 mg by mouth every 6 (six) hours as needed for spasms.   DULoxetine (CYMBALTA) 20 MG capsule Take 20 mg by mouth daily.   famotidine (PEPCID) 10 MG tablet    fexofenadine (ALLEGRA) 180 MG tablet    folic acid (FOLVITE) 1 MG tablet TAKE 1 TABLET BY MOUTH EVERY DAY   hydrOXYzine (ATARAX) 25 MG tablet Take 25 mg by mouth as needed.   medroxyPROGESTERone (PROVERA) 10 MG tablet Take 1 tablet (10 mg total) by mouth daily. (Patient taking differently: Take 10 mg by mouth as needed.)   Omega-3 Fatty Acids (FISH OIL) 1000 MG CAPS    propranolol (INDERAL) 40 MG tablet TAKE 1 TABLET (40 MG TOTAL) BY MOUTH 2 (TWO) TIMES DAILY. **PAITENT NEEDS TO BE SEEN IN OFFICE FOR ADDITIONAL REFILLS.**   rosuvastatin (CRESTOR) 20 MG tablet Take 1 tablet (20 mg total) by mouth daily.   [DISCONTINUED] albuterol (VENTOLIN HFA) 108 (90 Base) MCG/ACT inhaler INHALE 2 PUFFS BY MOUTH EVERY 4 HOURS AS NEEDED FOR WHEEZING OR SHORTNESS OF BREATH   No facility-administered medications prior to visit.    Review of Systems  Last CBC Lab Results   Component Value Date   WBC 7.7 03/27/2023   HGB 13.0 03/27/2023   HCT 40.6 03/27/2023   MCV 83 03/27/2023   MCH 26.6 03/27/2023   RDW 13.6 03/27/2023   PLT 473 (H) 03/27/2023   Last metabolic panel Lab Results  Component Value Date   GLUCOSE 101 (H) 03/27/2023   NA 138 03/27/2023   K 4.6 03/27/2023   CL 99 03/27/2023   CO2 24 03/27/2023   BUN 9 03/27/2023   CREATININE 0.63 03/27/2023   EGFR 116 03/27/2023   CALCIUM 9.7 03/27/2023   PROT 7.6 03/27/2023   ALBUMIN 4.3 03/27/2023   LABGLOB 3.3 03/27/2023   AGRATIO 1.3 03/27/2023   BILITOT 0.4 03/27/2023   ALKPHOS 113 03/27/2023   AST 14 03/27/2023   ALT 16 03/27/2023   ANIONGAP 8 01/30/2023   Last lipids Lab Results  Component Value Date   CHOL 162 03/27/2023   HDL 47 03/27/2023   LDLCALC 93 03/27/2023   TRIG 122 03/27/2023   CHOLHDL 3.4 03/27/2023   Last hemoglobin A1c Lab Results  Component Value Date   HGBA1C 6.2 (H) 03/27/2023   Last thyroid functions Lab Results  Component Value Date   TSH 1.930 03/27/2023   Last vitamin D Lab Results  Component Value Date   VD25OH 64.8 03/27/2023       Objective     Today's Vitals   03/27/23 0851  BP: 122/85  Pulse: 75  SpO2: 96%  Weight: 299 lb 1.9 oz (135.7 kg)  Height: 5\' 4"  (1.626 m)   Body mass index is 51.34 kg/m.  BP Readings from Last 3 Encounters:  04/03/23 129/85  03/27/23 122/85  02/20/23 132/82    Wt Readings from Last 3 Encounters:  04/03/23 299 lb (135.6 kg)  03/27/23 299 lb 1.9 oz (135.7 kg)  02/20/23 (Abnormal) 300 lb 3.2 oz (136.2 kg)    Physical Exam Vitals and nursing note reviewed.  Constitutional:      Appearance: Normal appearance. She is well-developed. She is obese.  HENT:     Head: Normocephalic and atraumatic.     Right Ear: Tympanic membrane, ear canal and external ear normal.     Left Ear: Tympanic membrane, ear canal and external ear normal.     Nose: Nose normal.     Mouth/Throat:     Mouth: Mucous membranes  are moist.     Pharynx: Oropharynx is clear.  Eyes:     Extraocular Movements: Extraocular movements intact.     Conjunctiva/sclera: Conjunctivae normal.     Pupils: Pupils are equal, round, and reactive to light.  Neck:     Vascular: No carotid bruit.  Cardiovascular:     Rate and Rhythm: Normal rate and regular rhythm.     Pulses: Normal pulses.     Heart sounds: Normal heart sounds.  Pulmonary:     Effort: Pulmonary effort is normal.     Breath sounds: Normal breath sounds.  Abdominal:     Palpations: Abdomen is soft.  Musculoskeletal:        General: Normal range of motion.     Cervical back: Normal range of motion and neck supple.  Lymphadenopathy:     Cervical: No cervical adenopathy.  Skin:    General: Skin is warm and dry.     Capillary Refill: Capillary refill takes less than 2 seconds.  Neurological:     General: No focal deficit present.     Mental Status: She is alert and oriented to person, place, and time.  Psychiatric:        Mood and Affect: Mood normal.        Behavior: Behavior normal.        Thought Content: Thought content normal.        Judgment: Judgment normal.     Results for orders placed or performed in visit on 03/27/23  CBC  Result Value Ref Range   WBC 7.7 3.4 - 10.8 x10E3/uL   RBC 4.89 3.77 - 5.28 x10E6/uL   Hemoglobin 13.0 11.1 - 15.9 g/dL   Hematocrit 16.1 09.6 - 46.6 %   MCV 83 79 - 97 fL   MCH 26.6 26.6 - 33.0 pg   MCHC 32.0 31.5 - 35.7 g/dL   RDW 04.5 40.9 - 81.1 %   Platelets 473 (H) 150 - 450 x10E3/uL  Comprehensive metabolic panel  Result Value Ref Range   Glucose 101 (H) 70 - 99 mg/dL   BUN 9 6 - 20 mg/dL   Creatinine, Ser 9.14 0.57 - 1.00 mg/dL   eGFR 782 >95 AO/ZHY/8.65   BUN/Creatinine Ratio 14 9 - 23   Sodium 138 134 - 144 mmol/L   Potassium 4.6 3.5 - 5.2 mmol/L   Chloride 99 96 - 106 mmol/L   CO2 24 20 - 29 mmol/L   Calcium 9.7 8.7 - 10.2 mg/dL   Total Protein 7.6 6.0 - 8.5 g/dL  Albumin 4.3 3.9 - 4.9 g/dL    Globulin, Total 3.3 1.5 - 4.5 g/dL   Albumin/Globulin Ratio 1.3 1.2 - 2.2   Bilirubin Total 0.4 0.0 - 1.2 mg/dL   Alkaline Phosphatase 113 44 - 121 IU/L   AST 14 0 - 40 IU/L   ALT 16 0 - 32 IU/L  Lipid panel  Result Value Ref Range   Cholesterol, Total 162 100 - 199 mg/dL   Triglycerides 161 0 - 149 mg/dL   HDL 47 >09 mg/dL   VLDL Cholesterol Cal 22 5 - 40 mg/dL   LDL Chol Calc (NIH) 93 0 - 99 mg/dL   Chol/HDL Ratio 3.4 0.0 - 4.4 ratio  Hemoglobin A1c  Result Value Ref Range   Hgb A1c MFr Bld 6.2 (H) 4.8 - 5.6 %   Est. average glucose Bld gHb Est-mCnc 131 mg/dL  VITAMIN D 25 Hydroxy (Vit-D Deficiency, Fractures)  Result Value Ref Range   Vit D, 25-Hydroxy 64.8 30.0 - 100.0 ng/mL  TSH + free T4  Result Value Ref Range   TSH 1.930 0.450 - 4.500 uIU/mL   Free T4 1.13 0.82 - 1.77 ng/dL    Assessment & Plan    Other fatigue Assessment & Plan: Check labs for further evaluation   Orders: -     TSH + free T4; Future  Anemia due to folic acid deficiency, unspecified deficiency type Assessment & Plan: Check anemia panel for further evaluation -treat deficiency as indicated    Menstrual periods irregular Assessment & Plan: Check labs, including thyroid panel for further evaluation    Mixed hyperlipidemia Assessment & Plan: Check fasting lipids and treat as indicated   Orders: -     Lipid panel; Future -     Comprehensive metabolic panel; Future -     CBC; Future  Prediabetes Assessment & Plan: Check labs with HgbaIc for further evaluation   Orders: -     Hemoglobin A1c; Future -     Comprehensive metabolic panel; Future -     CBC; Future  Vitamin D deficiency Assessment & Plan: Check vitamin d level and treat deficiency as indicated.    Orders: -     VITAMIN D 25 Hydroxy (Vit-D Deficiency, Fractures); Future     Return in about 6 months (around 09/26/2023) for blood pressure.         Carlean Jews, NP  [facility] [phone] (phone) [phone] (fax)  [facility]

## 2023-03-28 LAB — COMPREHENSIVE METABOLIC PANEL
ALT: 16 IU/L (ref 0–32)
AST: 14 IU/L (ref 0–40)
Albumin/Globulin Ratio: 1.3 (ref 1.2–2.2)
Albumin: 4.3 g/dL (ref 3.9–4.9)
Alkaline Phosphatase: 113 IU/L (ref 44–121)
BUN/Creatinine Ratio: 14 (ref 9–23)
BUN: 9 mg/dL (ref 6–20)
Bilirubin Total: 0.4 mg/dL (ref 0.0–1.2)
CO2: 24 mmol/L (ref 20–29)
Calcium: 9.7 mg/dL (ref 8.7–10.2)
Chloride: 99 mmol/L (ref 96–106)
Creatinine, Ser: 0.63 mg/dL (ref 0.57–1.00)
Globulin, Total: 3.3 g/dL (ref 1.5–4.5)
Glucose: 101 mg/dL — ABNORMAL HIGH (ref 70–99)
Potassium: 4.6 mmol/L (ref 3.5–5.2)
Sodium: 138 mmol/L (ref 134–144)
Total Protein: 7.6 g/dL (ref 6.0–8.5)
eGFR: 116 mL/min/{1.73_m2} (ref >59)

## 2023-03-28 LAB — LIPID PANEL
Chol/HDL Ratio: 3.4 ratio (ref 0.0–4.4)
Cholesterol, Total: 162 mg/dL (ref 100–199)
HDL: 47 mg/dL (ref >39)
LDL Chol Calc (NIH): 93 mg/dL (ref 0–99)
Triglycerides: 122 mg/dL (ref 0–149)
VLDL Cholesterol Cal: 22 mg/dL (ref 5–40)

## 2023-03-28 LAB — CBC
Hematocrit: 40.6 % (ref 34.0–46.6)
Hemoglobin: 13 g/dL (ref 11.1–15.9)
MCH: 26.6 pg (ref 26.6–33.0)
MCHC: 32 g/dL (ref 31.5–35.7)
MCV: 83 fL (ref 79–97)
Platelets: 473 10*3/uL — ABNORMAL HIGH (ref 150–450)
RBC: 4.89 x10E6/uL (ref 3.77–5.28)
RDW: 13.6 % (ref 11.7–15.4)
WBC: 7.7 10*3/uL (ref 3.4–10.8)

## 2023-03-28 LAB — HEMOGLOBIN A1C
Est. average glucose Bld gHb Est-mCnc: 131 mg/dL
Hgb A1c MFr Bld: 6.2 % — ABNORMAL HIGH (ref 4.8–5.6)

## 2023-03-28 LAB — VITAMIN D 25 HYDROXY (VIT D DEFICIENCY, FRACTURES): Vit D, 25-Hydroxy: 64.8 ng/mL (ref 30.0–100.0)

## 2023-03-28 LAB — TSH+FREE T4
Free T4: 1.13 ng/dL (ref 0.82–1.77)
TSH: 1.93 u[IU]/mL (ref 0.450–4.500)

## 2023-04-03 ENCOUNTER — Ambulatory Visit: Payer: Medicaid Other | Attending: Internal Medicine | Admitting: Internal Medicine

## 2023-04-03 ENCOUNTER — Encounter: Payer: Self-pay | Admitting: Internal Medicine

## 2023-04-03 VITALS — BP 129/85 | HR 84 | Resp 14 | Ht 63.75 in | Wt 299.0 lb

## 2023-04-03 DIAGNOSIS — I890 Lymphedema, not elsewhere classified: Secondary | ICD-10-CM | POA: Diagnosis not present

## 2023-04-03 DIAGNOSIS — M797 Fibromyalgia: Secondary | ICD-10-CM | POA: Diagnosis not present

## 2023-04-03 DIAGNOSIS — M222X1 Patellofemoral disorders, right knee: Secondary | ICD-10-CM | POA: Diagnosis not present

## 2023-04-03 DIAGNOSIS — K580 Irritable bowel syndrome with diarrhea: Secondary | ICD-10-CM

## 2023-04-03 DIAGNOSIS — L4050 Arthropathic psoriasis, unspecified: Secondary | ICD-10-CM | POA: Diagnosis not present

## 2023-04-03 DIAGNOSIS — M7918 Myalgia, other site: Secondary | ICD-10-CM

## 2023-04-03 NOTE — Progress Notes (Addendum)
Office Visit Note  Patient: Phyllis Gentry             Date of Birth: 1986/06/17           MRN: [id number]             PCP: Carlean Jews, NP Referring: Huel Cote, MD Visit Date: 04/03/2023 Occupation: Receptionist  Subjective:  New Patient (Initial Visit) (Patient states currently she is having joint pain in her ankle. Patient states she gets joint pain in her hands and neck at the end of the day.)   History of Present Illness: Phyllis Gentry is a 37 y.o. female here for multiple joint pains including left wrist inflammation osteoarthritis, and history of possible psoriatic arthritis and fibromyalgia syndrome.  She has a history of joint pain issues dating back to early childhood with extensive early glucocorticoid exposure due to severe allergy and hypersensitivity problems.  This has contributed to early issues with some carotid artery stenosis, and obesity with PCOS and some early degenerative arthritis changes.  She previously saw rheumatology in 2021 with iniital treatment on NSAIDs. Lab workup at that time showing elevated CRP and platelets with negative ANA testing and no radiographic sacroiliitis.  She has generally had some degree of ongoing joint pains typically affecting her neck and back and knees and ankles somewhat varying severity.  Knee pain worse on the right side and often provoked with climbing stairs or getting up from the ground or low furniture positions.  Also tends to have increase in neck and hand pains towards the end of the day.  Has had bilateral carpal tunnel syndrome but does not require any interventions for this and no complications with excessive weakness dropping items or difficulty performing routine tasks.  Referral to this visit was associated with development of significant left wrist pain and swelling with findings of tenosynovitis and orthopedics clinic.  She did not recall any particular injury activity change or illness preceding onset of  symptoms started around late January or beginning of February.  Since the left wrist pain improved she is not having large amounts of visible joint swelling although still has some persistent pain and stiffness.  She takes some Tylenol and ibuprofen as needed but not every day and usually not multiple times per day. Skin disease experiences episodic small patchy rashes usually resolving without any specific long-term treatment.  These were previously suspected as psoriasis but never on a long-term medication.  Currently skin is pretty clear did not see a flareup of rash associated with the left wrist swelling.  With previous rheumatology evaluation her findings with irregular brittle and thin fingernails with frequent splitting were suspected as related to possible psoriatic disease. Fibromyalgia with frequent pain including muscular stiffness and pain throughout the that stays pretty constantly present.  Usually does not feel widespread sensitivity all over or excessive pain with benign stimuli.  Does have issues with silent migraine pretty often.  Has irritable bowel symptoms predominantly diarrhea.  She was started on Cymbalta takes 20 mg daily feels this is partially beneficial for her body aches.   Activities of Daily Living:  Patient reports morning stiffness for 20 minutes.   Patient Reports nocturnal pain.  Difficulty dressing/grooming: Denies Difficulty climbing stairs: Reports Difficulty getting out of chair: Denies Difficulty using hands for taps, buttons, cutlery, and/or writing: Reports  Review of Systems  Constitutional:  Positive for fatigue.  HENT:  Negative for mouth sores and mouth dryness.   Eyes:  Positive  for dryness.  Respiratory:  Negative for shortness of breath.   Cardiovascular:  Positive for palpitations. Negative for chest pain.  Gastrointestinal:  Positive for constipation and diarrhea. Negative for blood in stool.  Endocrine: Negative for increased urination.   Genitourinary:  Negative for involuntary urination.  Musculoskeletal:  Positive for joint pain, joint pain, joint swelling, myalgias, muscle weakness, morning stiffness, muscle tenderness and myalgias. Negative for gait problem.  Skin:  Positive for sensitivity to sunlight. Negative for color change, rash and hair loss.  Allergic/Immunologic: Positive for susceptible to infections.  Neurological:  Positive for dizziness and headaches.  Hematological:  Negative for swollen glands.  Psychiatric/Behavioral:  Positive for depressed mood. Negative for sleep disturbance. The patient is nervous/anxious.     PMFS History:  Patient Active Problem List   Diagnosis Date Noted   Myofascial pain syndrome 04/03/2023   Patellofemoral pain syndrome of right knee 04/03/2023   Abscess of skin and subcutaneous tissue 10/13/2022   Carotid artery stenosis, asymptomatic, left 09/18/2022   Generalized anxiety disorder 04/18/2022   Menstrual periods irregular 03/03/2022   Anemia due to folic acid deficiency 03/03/2022   Recurrent major depressive disorder, in partial remission 03/03/2022   Class 3 severe obesity due to excess calories without serious comorbidity with body mass index (BMI) of 50.0 to 59.9 in adult 02/15/2022   Irritable bowel syndrome with diarrhea 01/27/2022   H/O atrial fibrillation without current medication 01/27/2022   Lymphedema 01/27/2022   Polycystic ovary disease 01/27/2022   BMI 50.0-59.9, adult 01/27/2022   Fibromyalgia 01/25/2022   Hypertension 01/25/2022   Atrial fibrillation 07/07/2021   Anxiety 07/07/2021   Asthma 07/07/2021   Psoriatic arthritis 02/10/2021   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, right 10/31/2020   Dizziness 07/10/2020   Pure hypercholesterolemia 07/10/2020   Hereditary lymphedema 07/05/2019   Biliary dyskinesia 04/16/2017   Endometrial polyp 08/27/2016   Menometrorrhagia 08/27/2016   Cervical radiculitis 10/30/2015   Carpal tunnel syndrome  07/03/2015    Past Medical History:  Diagnosis Date   Anemia    Anxiety    Asthma    Depression    Fibromyalgia    GERD (gastroesophageal reflux disease)    Hypertension    IBS (irritable bowel syndrome)    Lymphedema    PCOS (polycystic ovarian syndrome)    Prediabetes     Family History  Problem Relation Age of Onset   Dementia Mother    Breast cancer Mother        onset 71, 2nd 2016   Diabetes Mother    Heart disease Father    Diabetes Father    Irritable bowel syndrome Father    Breast cancer Maternal Aunt        onset 91   Breast cancer Other        maternal first cousin, onset 62   Colon cancer Maternal Great-grandfather    Esophageal cancer Neg Hx    Stomach cancer Neg Hx    Past Surgical History:  Procedure Laterality Date   CHOLECYSTECTOMY     DILATION AND CURETTAGE OF UTERUS     Social History   Social History Narrative   Not on file    There is no immunization history on file for this patient.   Objective: Vital Signs: BP 129/85 (BP Location: Right Arm, Patient Position: Sitting, Cuff Size: Normal)   Pulse 84   Resp 14   Ht 5' 3.75" (1.619 m)   Wt 299 lb (135.6 kg)  LMP 03/27/2023   BMI 51.73 kg/m    Physical Exam Constitutional:      Appearance: She is obese.  Eyes:     Conjunctiva/sclera: Conjunctivae normal.  Cardiovascular:     Rate and Rhythm: Normal rate and regular rhythm.  Pulmonary:     Effort: Pulmonary effort is normal.     Breath sounds: Normal breath sounds.  Musculoskeletal:     Comments: Trace pitting edema of the left ankle, there is some soft tissue swelling throughout the leg without significant pitting, no overlying skin lesions or discoloration  Skin:    Findings: No rash.     Comments: Some fingernail thinning with irregularity, no significant discoloration no nail pitting  Neurological:     Mental Status: She is alert.  Psychiatric:        Mood and Affect: Mood normal.      Musculoskeletal Exam:   Shoulders full ROM no tenderness or swelling Elbows full ROM no tenderness or swelling Wrists full ROM no tenderness or swelling Fingers full ROM no tenderness or swelling Midline and paraspinal muscle tenderness throughout the back more severe over lumbar spine extending down over tailbone as well, no radiation Some lateral hip tenderness to pressure better external than internal rotation of hips but bilaterally symmetric Knees full ROM right knee crepitus present tenderness to pressure anterior worse from patellar tendon and medial joint line Ankles full ROM no tenderness or swelling  Investigation: No additional findings.  Imaging: No results found.  Recent Labs: Lab Results  Component Value Date   WBC 7.7 03/27/2023   HGB 13.0 03/27/2023   PLT 473 (H) 03/27/2023   NA 138 03/27/2023   K 4.6 03/27/2023   CL 99 03/27/2023   CO2 24 03/27/2023   GLUCOSE 101 (H) 03/27/2023   BUN 9 03/27/2023   CREATININE 0.63 03/27/2023   BILITOT 0.4 03/27/2023   ALKPHOS 113 03/27/2023   AST 14 03/27/2023   ALT 16 03/27/2023   PROT 7.6 03/27/2023   ALBUMIN 4.3 03/27/2023   CALCIUM 9.7 03/27/2023    Speciality Comments: No specialty comments available.  Procedures:  No procedures performed Allergies: Aminophylline, Cefaclor, Cephalosporins, Diphenhydramine, Sulfa antibiotics, Clarithromycin, Iodinated contrast media, and Iodine   Assessment / Plan:     Visit Diagnoses: Psoriatic arthritis - Plan: Sedimentation rate, C-reactive protein, Mutated Citrullinated Vimentin (MCV) Antibody  Has history suggestive for psoriatic arthritis from previous rheumatology workup also from the fairly unprovoked development of tenosynovitis otherwise has more axial joint complaints on a daily basis.  I do not currently appreciate any definite peripheral joint synovitis and no active skin disease today.  Will check serum inflammatory markers also MCV antibody but clinically would monitor for now.  Discussed  we would like to try and see her back ASAP if developing a similar flareup of joint swelling as she had recently or more extensive skin rashes.  Fibromyalgia  Agree with diagnosis of fibromyalgia she does not have completely allodynia but certainly myofascial pain and associated symptoms such as the fatigue irritable bowels and chronic migraine.  Agree with continuing Cymbalta as a first-line choice for this.  Also provided some additional information recommended to check out [employer] [place] fibro guide website and other resources for self-management advice.  Lymphedema  Longstanding leg swelling with minimal pitting has had previous abdominal and pelvis imaging workup without findings.  Can consider referral to lymphedema clinic if becomes more symptomatic.  Patellofemoral pain syndrome of right knee  Mild tricompartmental right knee  osteoarthritis on imaging from last year current pain most severe in the anterior portion of the joint and provoked with movements requiring more extensive flexion.  Discussed a bit of arthritis and initial treatments usually to include possible NSAIDs weight loss and leg strengthening.  Orders: Orders Placed This Encounter  Procedures   Sedimentation rate   C-reactive protein   Mutated Citrullinated Vimentin (MCV) Antibody   No orders of the defined types were placed in this encounter.    Follow-Up Instructions: Return in about 6 months (around 10/03/2023) for New pt ?PsA/OA/FMS f/u 58mos.   Fuller Plan, MD  Note - This record has been created using [employer].  Chart creation errors have been sought, but may not always  have been located. Such creation errors do not reflect on  the standard of medical care.

## 2023-04-03 NOTE — Patient Instructions (Signed)
I recommend checking out the [employer] patient-centered guide for fibromyalgia and chronic pain management: fibroguide.med.[employer].edu  

## 2023-04-04 DIAGNOSIS — F411 Generalized anxiety disorder: Secondary | ICD-10-CM | POA: Diagnosis not present

## 2023-04-04 DIAGNOSIS — F331 Major depressive disorder, recurrent, moderate: Secondary | ICD-10-CM | POA: Diagnosis not present

## 2023-04-07 DIAGNOSIS — D229 Melanocytic nevi, unspecified: Secondary | ICD-10-CM | POA: Diagnosis not present

## 2023-04-07 DIAGNOSIS — L814 Other melanin hyperpigmentation: Secondary | ICD-10-CM | POA: Diagnosis not present

## 2023-04-07 DIAGNOSIS — L821 Other seborrheic keratosis: Secondary | ICD-10-CM | POA: Diagnosis not present

## 2023-04-07 DIAGNOSIS — L700 Acne vulgaris: Secondary | ICD-10-CM | POA: Diagnosis not present

## 2023-04-07 LAB — MUTATED CITRULLINATED VIMENTIN (MCV) ANTIBODY: MUTATED CITRULLINATED VIMENTIN (MCV) AB: <20 U/mL (ref <20)

## 2023-04-07 LAB — SEDIMENTATION RATE: Sed Rate: 36 mm/h — ABNORMAL HIGH (ref 0–20)

## 2023-04-07 LAB — C-REACTIVE PROTEIN: CRP: 13.5 mg/L — ABNORMAL HIGH (ref <8.0)

## 2023-04-08 NOTE — Progress Notes (Signed)
Sedimentation rate is 36 and CRP is 13.5 these markers for inflammation are above normal but not as high as she was previously. No strong indication that we need to start additional medication right now. If she starts to experience increased problems like we discussed we can take another look otherwise can follow up as planned later this year.

## 2023-04-09 DIAGNOSIS — F331 Major depressive disorder, recurrent, moderate: Secondary | ICD-10-CM | POA: Diagnosis not present

## 2023-04-09 DIAGNOSIS — F411 Generalized anxiety disorder: Secondary | ICD-10-CM | POA: Diagnosis not present

## 2023-04-16 DIAGNOSIS — F411 Generalized anxiety disorder: Secondary | ICD-10-CM | POA: Diagnosis not present

## 2023-04-16 DIAGNOSIS — F331 Major depressive disorder, recurrent, moderate: Secondary | ICD-10-CM | POA: Diagnosis not present

## 2023-04-17 DIAGNOSIS — F411 Generalized anxiety disorder: Secondary | ICD-10-CM | POA: Diagnosis not present

## 2023-04-17 DIAGNOSIS — F331 Major depressive disorder, recurrent, moderate: Secondary | ICD-10-CM | POA: Diagnosis not present

## 2023-04-22 ENCOUNTER — Other Ambulatory Visit: Payer: Self-pay | Admitting: Nurse Practitioner

## 2023-04-22 DIAGNOSIS — J4521 Mild intermittent asthma with (acute) exacerbation: Secondary | ICD-10-CM

## 2023-04-23 DIAGNOSIS — F411 Generalized anxiety disorder: Secondary | ICD-10-CM | POA: Diagnosis not present

## 2023-04-23 DIAGNOSIS — F331 Major depressive disorder, recurrent, moderate: Secondary | ICD-10-CM | POA: Diagnosis not present

## 2023-04-25 DIAGNOSIS — R5383 Other fatigue: Secondary | ICD-10-CM | POA: Insufficient documentation

## 2023-04-25 DIAGNOSIS — E559 Vitamin D deficiency, unspecified: Secondary | ICD-10-CM | POA: Insufficient documentation

## 2023-04-25 DIAGNOSIS — R7303 Prediabetes: Secondary | ICD-10-CM | POA: Insufficient documentation

## 2023-04-25 DIAGNOSIS — E782 Mixed hyperlipidemia: Secondary | ICD-10-CM | POA: Insufficient documentation

## 2023-04-25 NOTE — Assessment & Plan Note (Signed)
Check anemia panel for further evaluation -treat deficiency as indicated

## 2023-04-25 NOTE — Assessment & Plan Note (Signed)
Check labs for further evaluation.  

## 2023-04-25 NOTE — Assessment & Plan Note (Signed)
Check labs with HgbaIc for further evaluation

## 2023-04-25 NOTE — Assessment & Plan Note (Signed)
Check fasting lipids and treat as indicated

## 2023-04-25 NOTE — Assessment & Plan Note (Signed)
Check vitamin d level and treat deficiency as indicated.   

## 2023-04-25 NOTE — Assessment & Plan Note (Signed)
Check labs, including thyroid panel for further evaluation

## 2023-04-28 ENCOUNTER — Other Ambulatory Visit: Payer: Self-pay

## 2023-04-28 DIAGNOSIS — I10 Essential (primary) hypertension: Secondary | ICD-10-CM

## 2023-04-28 DIAGNOSIS — I4891 Unspecified atrial fibrillation: Secondary | ICD-10-CM

## 2023-04-28 MED ORDER — PROPRANOLOL HCL 40 MG PO TABS: 40.0000 mg | ORAL_TABLET | Freq: Two times a day (BID) | ORAL | 1 refills | Status: DC

## 2023-05-08 DIAGNOSIS — F331 Major depressive disorder, recurrent, moderate: Secondary | ICD-10-CM | POA: Diagnosis not present

## 2023-05-08 DIAGNOSIS — F411 Generalized anxiety disorder: Secondary | ICD-10-CM | POA: Diagnosis not present

## 2023-05-20 ENCOUNTER — Encounter: Payer: Self-pay | Admitting: Internal Medicine

## 2023-05-20 NOTE — Telephone Encounter (Signed)
Patient is not currently prescribed anything from our office. Please advise.

## 2023-05-21 DIAGNOSIS — F331 Major depressive disorder, recurrent, moderate: Secondary | ICD-10-CM | POA: Diagnosis not present

## 2023-05-21 DIAGNOSIS — F411 Generalized anxiety disorder: Secondary | ICD-10-CM | POA: Diagnosis not present

## 2023-05-26 ENCOUNTER — Other Ambulatory Visit: Payer: Self-pay | Admitting: Nurse Practitioner

## 2023-05-26 DIAGNOSIS — Z1231 Encounter for screening mammogram for malignant neoplasm of breast: Secondary | ICD-10-CM

## 2023-05-27 ENCOUNTER — Other Ambulatory Visit: Payer: Self-pay | Admitting: Nurse Practitioner

## 2023-05-27 DIAGNOSIS — F411 Generalized anxiety disorder: Secondary | ICD-10-CM | POA: Diagnosis not present

## 2023-05-27 DIAGNOSIS — F331 Major depressive disorder, recurrent, moderate: Secondary | ICD-10-CM | POA: Diagnosis not present

## 2023-05-27 DIAGNOSIS — J4521 Mild intermittent asthma with (acute) exacerbation: Secondary | ICD-10-CM

## 2023-05-28 DIAGNOSIS — Z1231 Encounter for screening mammogram for malignant neoplasm of breast: Secondary | ICD-10-CM

## 2023-05-29 DIAGNOSIS — J020 Streptococcal pharyngitis: Secondary | ICD-10-CM | POA: Diagnosis not present

## 2023-06-04 NOTE — Telephone Encounter (Signed)
Please advise 

## 2023-06-05 ENCOUNTER — Ambulatory Visit (INDEPENDENT_AMBULATORY_CARE_PROVIDER_SITE_OTHER): Payer: Medicaid Other

## 2023-06-05 DIAGNOSIS — Z1231 Encounter for screening mammogram for malignant neoplasm of breast: Secondary | ICD-10-CM

## 2023-06-09 NOTE — Progress Notes (Signed)
Negative mammogram

## 2023-06-11 DIAGNOSIS — F331 Major depressive disorder, recurrent, moderate: Secondary | ICD-10-CM | POA: Diagnosis not present

## 2023-06-11 DIAGNOSIS — F411 Generalized anxiety disorder: Secondary | ICD-10-CM | POA: Diagnosis not present

## 2023-06-12 ENCOUNTER — Other Ambulatory Visit: Payer: Self-pay

## 2023-06-12 DIAGNOSIS — D529 Folate deficiency anemia, unspecified: Secondary | ICD-10-CM

## 2023-06-12 MED ORDER — FOLIC ACID 1 MG PO TABS: 1.0000 mg | ORAL_TABLET | Freq: Every day | ORAL | 1 refills | Status: DC

## 2023-06-16 ENCOUNTER — Other Ambulatory Visit: Payer: Self-pay | Admitting: Nurse Practitioner

## 2023-06-16 DIAGNOSIS — K580 Irritable bowel syndrome with diarrhea: Secondary | ICD-10-CM

## 2023-06-16 MED ORDER — DICYCLOMINE HCL 20 MG PO TABS: 20.0000 mg | ORAL_TABLET | Freq: Four times a day (QID) | ORAL | 1 refills | Status: DC | PRN

## 2023-06-18 ENCOUNTER — Ambulatory Visit (HOSPITAL_BASED_OUTPATIENT_CLINIC_OR_DEPARTMENT_OTHER): Payer: Medicaid Other | Admitting: Orthopaedic Surgery

## 2023-06-18 DIAGNOSIS — F331 Major depressive disorder, recurrent, moderate: Secondary | ICD-10-CM | POA: Diagnosis not present

## 2023-06-18 DIAGNOSIS — F411 Generalized anxiety disorder: Secondary | ICD-10-CM | POA: Diagnosis not present

## 2023-06-24 ENCOUNTER — Encounter: Payer: Medicaid Other | Admitting: Internal Medicine

## 2023-07-02 DIAGNOSIS — F411 Generalized anxiety disorder: Secondary | ICD-10-CM | POA: Diagnosis not present

## 2023-07-02 DIAGNOSIS — F331 Major depressive disorder, recurrent, moderate: Secondary | ICD-10-CM | POA: Diagnosis not present

## 2023-07-09 DIAGNOSIS — F411 Generalized anxiety disorder: Secondary | ICD-10-CM | POA: Diagnosis not present

## 2023-07-09 DIAGNOSIS — F331 Major depressive disorder, recurrent, moderate: Secondary | ICD-10-CM | POA: Diagnosis not present

## 2023-07-10 ENCOUNTER — Ambulatory Visit (HOSPITAL_BASED_OUTPATIENT_CLINIC_OR_DEPARTMENT_OTHER): Payer: Medicaid Other | Admitting: Orthopaedic Surgery

## 2023-07-15 ENCOUNTER — Other Ambulatory Visit: Payer: Self-pay | Admitting: Nurse Practitioner

## 2023-07-15 DIAGNOSIS — J4521 Mild intermittent asthma with (acute) exacerbation: Secondary | ICD-10-CM

## 2023-07-17 DIAGNOSIS — F331 Major depressive disorder, recurrent, moderate: Secondary | ICD-10-CM | POA: Diagnosis not present

## 2023-07-17 DIAGNOSIS — F411 Generalized anxiety disorder: Secondary | ICD-10-CM | POA: Diagnosis not present

## 2023-07-23 DIAGNOSIS — F411 Generalized anxiety disorder: Secondary | ICD-10-CM | POA: Diagnosis not present

## 2023-07-23 DIAGNOSIS — F331 Major depressive disorder, recurrent, moderate: Secondary | ICD-10-CM | POA: Diagnosis not present

## 2023-07-30 DIAGNOSIS — F331 Major depressive disorder, recurrent, moderate: Secondary | ICD-10-CM | POA: Diagnosis not present

## 2023-07-30 DIAGNOSIS — F411 Generalized anxiety disorder: Secondary | ICD-10-CM | POA: Diagnosis not present

## 2023-08-04 DIAGNOSIS — L0231 Cutaneous abscess of buttock: Secondary | ICD-10-CM | POA: Diagnosis not present

## 2023-08-06 DIAGNOSIS — F411 Generalized anxiety disorder: Secondary | ICD-10-CM | POA: Diagnosis not present

## 2023-08-06 DIAGNOSIS — F331 Major depressive disorder, recurrent, moderate: Secondary | ICD-10-CM | POA: Diagnosis not present

## 2023-08-13 DIAGNOSIS — F411 Generalized anxiety disorder: Secondary | ICD-10-CM | POA: Diagnosis not present

## 2023-08-13 DIAGNOSIS — F331 Major depressive disorder, recurrent, moderate: Secondary | ICD-10-CM | POA: Diagnosis not present

## 2023-08-14 ENCOUNTER — Encounter: Payer: Self-pay | Admitting: Orthopedic Surgery

## 2023-08-14 ENCOUNTER — Ambulatory Visit (INDEPENDENT_AMBULATORY_CARE_PROVIDER_SITE_OTHER): Payer: Medicaid Other | Admitting: Orthopedic Surgery

## 2023-08-14 VITALS — BP 126/84 | HR 81 | Temp 97.5°F | Resp 17 | Ht 63.75 in | Wt 305.4 lb

## 2023-08-14 DIAGNOSIS — I10 Essential (primary) hypertension: Secondary | ICD-10-CM | POA: Diagnosis not present

## 2023-08-14 DIAGNOSIS — E7800 Pure hypercholesterolemia, unspecified: Secondary | ICD-10-CM | POA: Diagnosis not present

## 2023-08-14 DIAGNOSIS — J4520 Mild intermittent asthma, uncomplicated: Secondary | ICD-10-CM | POA: Diagnosis not present

## 2023-08-14 DIAGNOSIS — J3089 Other allergic rhinitis: Secondary | ICD-10-CM | POA: Diagnosis not present

## 2023-08-14 DIAGNOSIS — F331 Major depressive disorder, recurrent, moderate: Secondary | ICD-10-CM | POA: Diagnosis not present

## 2023-08-14 DIAGNOSIS — R7303 Prediabetes: Secondary | ICD-10-CM | POA: Diagnosis not present

## 2023-08-14 DIAGNOSIS — F4321 Adjustment disorder with depressed mood: Secondary | ICD-10-CM

## 2023-08-14 DIAGNOSIS — E282 Polycystic ovarian syndrome: Secondary | ICD-10-CM

## 2023-08-14 DIAGNOSIS — M797 Fibromyalgia: Secondary | ICD-10-CM

## 2023-08-14 DIAGNOSIS — F411 Generalized anxiety disorder: Secondary | ICD-10-CM | POA: Diagnosis not present

## 2023-08-14 DIAGNOSIS — K589 Irritable bowel syndrome without diarrhea: Secondary | ICD-10-CM

## 2023-08-14 NOTE — Progress Notes (Signed)
Careteam: Patient Care Team: Octavia Heir, NP as PCP - General (Adult Health Nurse Practitioner) Jodelle Red, MD as PCP - Cardiology (Cardiology)  Seen by: Hazle Nordmann, AGNP-C  PLACE OF SERVICE:  [facility]  Advanced Directive information Does Patient Have a Medical Advance Directive?: No, Would patient like information on creating a medical advance directive?: No - Patient declined  Allergies  Allergen Reactions   Aminophylline Anaphylaxis   Cefaclor Anaphylaxis   Cephalosporins Anaphylaxis   Diphenhydramine Anaphylaxis   Sulfa Antibiotics Anaphylaxis   Clarithromycin    Iodinated Contrast Media    Iodine     Other reaction(s): Unknown    Chief Complaint  Patient presents with   Establish Care    New Patient.      HPI: Patient is a 37 y.o. female seen today to establish at [facility].   Previous provider was Vincent Gros NP>provider left practice. Originally from [place]. Moved to [place] 2 years ago. Married. No children. 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 Lives in apartment, 3 stories.   HTN- diagnosed in her teens, blood pressures have increased with weight gain, continues to take propranolol, followed by cardiology  HLD- remains on rosuvastatin  Obesity- currently at highest weight, would like to be 250 lbs, she has lost weight with Weight Watchers> max wright loss was 15-20 lbs, admits to emotional eating  PCOS- gynecologist Dr. Buckner [place], she was given Ozempic but has not stopped, scared to try due to gastroparesis, irregular preiors, on Provera very 3 months   Asthma- uses albuterol prn, unable to take Singulair due to suicidal ideation  Allergies- remains on Allegra, unable to take Zyrtec> "withdrawal symptoms"  Fibromyalgia- remains on Cymbalta  IBS/cramping- remains on dicyclomine  Grieving- followed by therapy, remains on Cymbalta  No recent hospitalizations or surgeries.   Past Surgery: Lap cholecystectomy 2018.    Mammogram 06/09/2023> no malignancy> mother had breast cancer 2x  Family history:  Father passed at age 61- HTN, T2DM, severe OA, depression, death possible suicide  Mother passed at age 89 (recently)- Alzheimer's dementia, HTN, HLD, prediabetes  No siblings  Smoking: nonsmoker Alcohol: does not drink Illicit drug use: no past use or abuse  Eye exam: 1 year ago Dental exam: cleaning 3-4 months ago  No living will/ HPOA.     Review of Systems:  Review of Systems  Constitutional:  Positive for malaise/fatigue.  HENT:  Positive for congestion.   Eyes:  Negative for blurred vision.  Respiratory:  Positive for cough and wheezing. Negative for shortness of breath.   Cardiovascular:  Negative for chest pain and leg swelling.  Gastrointestinal:  Positive for abdominal pain and diarrhea.  Genitourinary: Negative.   Musculoskeletal:  Positive for joint pain and myalgias.  Neurological:  Negative for dizziness and weakness.  Endo/Heme/Allergies:  Positive for environmental allergies.  Psychiatric/Behavioral:  Positive for depression. Negative for suicidal ideas. The patient is nervous/anxious. The patient does not have insomnia.     Past Medical History:  Diagnosis Date   Anemia    Anxiety    Asthma    Depression    Fibromyalgia    GERD (gastroesophageal reflux disease)    Hypertension    IBS (irritable bowel syndrome)    Lymphedema    PCOS (polycystic ovarian syndrome)    Prediabetes    Past Surgical History:  Procedure Laterality Date   CHOLECYSTECTOMY     DILATION AND CURETTAGE OF UTERUS     Social History:  reports that she has never smoked. She has been exposed to tobacco smoke. She has never used smokeless tobacco. She reports that she does not currently use alcohol. She reports that she does not currently use drugs.  Family History  Problem Relation Age of Onset   Dementia Mother    Breast cancer Mother        onset 70, 2nd 2016   Diabetes Mother    Heart  disease Father    Diabetes Father    Irritable bowel syndrome Father    Breast cancer Maternal Aunt        onset 28   Breast cancer Other        maternal first cousin, onset 59   Colon cancer Maternal Great-grandfather    Esophageal cancer Neg Hx    Stomach cancer Neg Hx     Medications: Patient's Medications  New Prescriptions   No medications on file  Previous Medications   ALBUTEROL (PROVENTIL) (2.5 MG/3ML) 0.083% NEBULIZER SOLUTION    Take 3 mLs (2.5 mg total) by nebulization every 6 (six) hours as needed for wheezing or shortness of breath.   ALBUTEROL (VENTOLIN HFA) 108 (90 BASE) MCG/ACT INHALER    INHALE 2 PUFFS BY MOUTH EVERY 4 HOURS AS NEEDED FOR WHEEZING OR SHORTNESS OF BREATH   COENZYME Q10 (COQ10 PO)    Take 200 mg by mouth daily.   CYANOCOBALAMIN 1000 MCG SUBL    PLACE 1 TABLET(S) EVERY DAY BY SUBLINGUAL ROUTE.   DICYCLOMINE (BENTYL) 20 MG TABLET    Take 1 tablet (20 mg total) by mouth every 6 (six) hours as needed for spasms.   DULOXETINE (CYMBALTA) 20 MG CAPSULE    Take 20 mg by mouth daily.   FEXOFENADINE (ALLEGRA) 180 MG TABLET       FOLIC ACID (FOLVITE) 1 MG TABLET    Take 1 tablet (1 mg total) by mouth daily.   MEDROXYPROGESTERONE (PROVERA) 10 MG TABLET    Take 10 mg by mouth as needed.   OMEPRAZOLE (PRILOSEC) 20 MG CAPSULE    Take 20 mg by mouth at bedtime.   OVER THE COUNTER MEDICATION    Take 1,320 mg by mouth every 30 (thirty) days. Caprylic Acid   PROPRANOLOL (INDERAL) 40 MG TABLET    Take 1 tablet (40 mg total) by mouth 2 (two) times daily. **paitent needs to be seen in office for additional refills.**   ROSUVASTATIN (CRESTOR) 20 MG TABLET    Take 1 tablet (20 mg total) by mouth daily.  Modified Medications   No medications on file  Discontinued Medications   CHOLECALCIFEROL (VITAMIN D) 125 MCG (5000 UT) CAPS       FAMOTIDINE (PEPCID) 10 MG TABLET       HYDROXYZINE (ATARAX) 25 MG TABLET    Take 25 mg by mouth as needed.   MEDROXYPROGESTERONE (PROVERA) 10  MG TABLET    Take 1 tablet (10 mg total) by mouth daily.   OMEGA-3 FATTY ACIDS (FISH OIL) 1000 MG CAPS        Physical Exam:  Vitals:   08/14/23 0916  BP: (!) 140/98  Pulse: 81  Resp: 17  Temp: (!) 97.5 F (36.4 C)  SpO2: 97%  Weight: (!) 305 lb 6.4 oz (138.5 kg)  Height: 5' 3.75" (1.619 m)   Body mass index is 52.83 kg/m. Wt Readings from Last 3 Encounters:  08/14/23 (!) 305 lb 6.4 oz (138.5 kg)  04/03/23 299 lb (135.6 kg)  03/27/23 299 lb  1.9 oz (135.7 kg)    Physical Exam Vitals reviewed.  Constitutional:      Appearance: She is obese.  HENT:     Head: Normocephalic.  Eyes:     General:        Right eye: No discharge.        Left eye: No discharge.  Cardiovascular:     Rate and Rhythm: Normal rate and regular rhythm.     Pulses: Normal pulses.     Heart sounds: Normal heart sounds.  Pulmonary:     Effort: Pulmonary effort is normal. No respiratory distress.     Breath sounds: Normal breath sounds. No wheezing.  Abdominal:     General: Bowel sounds are normal.     Palpations: Abdomen is soft.  Musculoskeletal:     Cervical back: Neck supple.     Right lower leg: No edema.     Left lower leg: No edema.  Skin:    General: Skin is warm.     Capillary Refill: Capillary refill takes less than 2 seconds.  Neurological:     General: No focal deficit present.     Mental Status: She is alert and oriented to person, place, and time.  Psychiatric:        Mood and Affect: Mood normal.    Labs reviewed: Basic Metabolic Panel: Recent Labs    01/30/23 2312 03/27/23 0946  NA 134* 138  K 3.6 4.6  CL 100 99  CO2 26 24  GLUCOSE 154* 101*  BUN 11 9  CREATININE 0.73 0.63  CALCIUM 9.1 9.7  TSH 3.053 1.930   Liver Function Tests: Recent Labs    01/30/23 2312 03/27/23 0946  AST 21 14  ALT 17 16  ALKPHOS 72 113  BILITOT 0.5 0.4  PROT 7.0 7.6  ALBUMIN 3.5 4.3   Recent Labs    01/30/23 2312  LIPASE 39   No results for input(s): "AMMONIA" in the last  8760 hours. CBC: Recent Labs    01/30/23 2312 03/27/23 0946  WBC 9.7 7.7  NEUTROABS 6.6  --   HGB 12.4 13.0  HCT 39.6 40.6  MCV 85.2 83  PLT 395 473*   Lipid Panel: Recent Labs    03/27/23 0946  CHOL 162  HDL 47  LDLCALC 93  TRIG 122  CHOLHDL 3.4   TSH: Recent Labs    01/30/23 2312 03/27/23 0946  TSH 3.053 1.930   A1C: Lab Results  Component Value Date   HGBA1C 6.2 (H) 03/27/2023     Assessment/Plan 1. Essential hypertension - controlled - cont propanolol - CBC with Differential/Platelet - Complete Metabolic Panel with eGFR  2. Pure hypercholesterolemia - total 162, LDL 93 03/27/2023 - cont Crestor - cont diet low in processed and fried foods - Lipid Panel  3. Morbid obesity due to excess calories (HCC) - BMI 52.83 - recommend tracking calories x 1 week - recommend < 1500 calories/day - recommend exercise 150 min/ week - candidate for GLP-1 - Amb Ref to Medical Weight Management  4. Prediabetes - A1c 6.2 03/27/2023 - related to obesity - cont diet low in carbs and sugars - Hemoglobin A1c  5. PCOS (polycystic ovarian syndrome) - followed by gynecology - cont Provera   6. Mild intermittent asthma, unspecified whether complicated - no recent exacerbations - cont albuterol prn - unable to tolerate Singulair due to suicidal ideation  7. Environmental and seasonal allergies - see above - cont Allegra  8. Fibromyalgia - cont  Cymbalta  9. Irritable bowel syndrome, unspecified type - cont Bentyl  10. Grieving - mother passed last month - cont counseling - cont Cymbalta  Total time: 37. Greater than 50% of total time spent doing patient education regarding health maintenance, HTN, prediabetes, obesity, PCOS and asthma including symptom/medication management.    Next appt: 09/18/2023  Hazle Nordmann, Juel Burrow  [facility] [phone]

## 2023-08-15 ENCOUNTER — Other Ambulatory Visit: Payer: Self-pay | Admitting: Orthopedic Surgery

## 2023-08-15 DIAGNOSIS — E1165 Type 2 diabetes mellitus with hyperglycemia: Secondary | ICD-10-CM

## 2023-08-15 LAB — CBC WITH DIFFERENTIAL/PLATELET
Absolute Monocytes: 596 {cells}/uL (ref 200–950)
Basophils Absolute: 62 {cells}/uL (ref 0–200)
Basophils Relative: 0.7 %
Eosinophils Absolute: 116 {cells}/uL (ref 15–500)
Eosinophils Relative: 1.3 %
HCT: 38.8 % (ref 35.0–45.0)
Hemoglobin: 12.3 g/dL (ref 11.7–15.5)
Lymphs Abs: 2439 {cells}/uL (ref 850–3900)
MCH: 26.7 pg — ABNORMAL LOW (ref 27.0–33.0)
MCHC: 31.7 g/dL — ABNORMAL LOW (ref 32.0–36.0)
MCV: 84.2 fL (ref 80.0–100.0)
MPV: 9.5 fL (ref 7.5–12.5)
Monocytes Relative: 6.7 %
Neutro Abs: 5687 cells/uL (ref 1500–7800)
Neutrophils Relative %: 63.9 %
Platelets: 464 10*3/uL — ABNORMAL HIGH (ref 140–400)
RBC: 4.61 10*6/uL (ref 3.80–5.10)
RDW: 13.4 % (ref 11.0–15.0)
Total Lymphocyte: 27.4 %
WBC: 8.9 10*3/uL (ref 3.8–10.8)

## 2023-08-15 LAB — COMPLETE METABOLIC PANEL WITH GFR
AG Ratio: 1.3 (calc) (ref 1.0–2.5)
ALT: 12 U/L (ref 6–29)
AST: 14 U/L (ref 10–30)
Albumin: 4.1 g/dL (ref 3.6–5.1)
Alkaline phosphatase (APISO): 90 U/L (ref 31–125)
BUN: 12 mg/dL (ref 7–25)
CO2: 30 mmol/L (ref 20–32)
Calcium: 9.4 mg/dL (ref 8.6–10.2)
Chloride: 102 mmol/L (ref 98–110)
Creat: 0.72 mg/dL (ref 0.50–0.97)
Globulin: 3.2 g/dL (ref 1.9–3.7)
Glucose, Bld: 93 mg/dL (ref 65–99)
Potassium: 4.7 mmol/L (ref 3.5–5.3)
Sodium: 138 mmol/L (ref 135–146)
Total Bilirubin: 0.4 mg/dL (ref 0.2–1.2)
Total Protein: 7.3 g/dL (ref 6.1–8.1)
eGFR: 110 mL/min/{1.73_m2} (ref >=60)

## 2023-08-15 LAB — HEMOGLOBIN A1C
Hgb A1c MFr Bld: 6.4 %{Hb} — ABNORMAL HIGH (ref <5.7)
Mean Plasma Glucose: 137 mg/dL
eAG (mmol/L): 7.6 mmol/L

## 2023-08-15 LAB — LIPID PANEL
Cholesterol: 128 mg/dL (ref <200)
HDL: 45 mg/dL — ABNORMAL LOW (ref >=50)
LDL Cholesterol (Calc): 65 mg/dL
Non-HDL Cholesterol (Calc): 83 mg/dL (ref <130)
Total CHOL/HDL Ratio: 2.8 (calc) (ref <5.0)
Triglycerides: 92 mg/dL (ref <150)

## 2023-08-15 MED ORDER — METFORMIN HCL ER 500 MG PO TB24
500.0000 mg | ORAL_TABLET | Freq: Every day | ORAL | 1 refills | Status: DC
Start: 2023-08-15 — End: 2023-11-10

## 2023-08-19 ENCOUNTER — Other Ambulatory Visit: Payer: Self-pay

## 2023-08-19 DIAGNOSIS — E1165 Type 2 diabetes mellitus with hyperglycemia: Secondary | ICD-10-CM

## 2023-08-20 DIAGNOSIS — F411 Generalized anxiety disorder: Secondary | ICD-10-CM | POA: Diagnosis not present

## 2023-08-20 DIAGNOSIS — F331 Major depressive disorder, recurrent, moderate: Secondary | ICD-10-CM | POA: Diagnosis not present

## 2023-08-21 ENCOUNTER — Encounter: Payer: Self-pay | Admitting: Nurse Practitioner

## 2023-08-21 ENCOUNTER — Ambulatory Visit (INDEPENDENT_AMBULATORY_CARE_PROVIDER_SITE_OTHER): Payer: Medicaid Other | Admitting: Nurse Practitioner

## 2023-08-21 VITALS — BP 128/72 | HR 79 | Ht 64.0 in | Wt 302.6 lb

## 2023-08-21 DIAGNOSIS — K529 Noninfective gastroenteritis and colitis, unspecified: Secondary | ICD-10-CM

## 2023-08-21 DIAGNOSIS — K219 Gastro-esophageal reflux disease without esophagitis: Secondary | ICD-10-CM

## 2023-08-21 MED ORDER — NA SULFATE-K SULFATE-MG SULF 17.5-3.13-1.6 GM/177ML PO SOLN: 1.0000 | Freq: Once | ORAL | 0 refills | Status: AC

## 2023-08-21 NOTE — Patient Instructions (Addendum)
You have been scheduled for an endoscopy and colonoscopy. Please follow the written instructions given to you at your visit today.  Please pick up your prep supplies at the pharmacy within the next 1-3 days.  If you use inhalers (even only as needed), please bring them with you on the day of your procedure.  DO NOT TAKE 7 DAYS PRIOR TO TEST- Trulicity (dulaglutide) Ozempic, Wegovy (semaglutide) Mounjaro (tirzepatide) Bydureon Bcise (exanatide extended release)  DO NOT TAKE 1 DAY PRIOR TO YOUR TEST Rybelsus (semaglutide) Adlyxin (lixisenatide) Victoza (liraglutide) Byetta (exanatide) ___________________________________________________________________________  Due to recent changes in healthcare laws, you may see the results of your imaging and laboratory studies on MyChart before your provider has had a chance to review them.  We understand that in some cases there may be results that are confusing or concerning to you. Not all laboratory results come back in the same time frame and the provider may be waiting for multiple results in order to interpret others.  Please give [place] 48 hours in order for your provider to thoroughly review all the results before contacting the office for clarification of your results.   Thank you for trusting me with your gastrointestinal care!   Alcide Evener, CRNP

## 2023-08-21 NOTE — Progress Notes (Signed)
08/21/2023 Phyllis Gentry [id number] [birth year]   Chief Complaint:  History of Present Illness: Phyllis Gentry is a 37 year old female with a past medical history of anxiety and depression, hypertension, atrial fibrillation x 1 in 2018 without recurrence, asthma, prediabetes, PCOS, fibromyalgia, psoriatic arthritis, LE lymphedema uses a leg lymph pump, GERD, chronic abdominal pain, chronic diarrhea and lower GI bleed 2018 when on Pradaxa briefly for afib. S/P cholecystectomy 04/16/2017. Past D & C.   I initially saw Phyllis Gentry in office 06/05/2022 for further evaluation regarding chronic GERD, abdominal pain and diarrhea.  An EGD and colonoscopy in the hospital setting was ordered but was not scheduled.  The patient presents today to schedule these procedures.  She stated her GI symptoms remain chronic without any significant change when compared to her symptoms when seen in office 06/05/2022.  She having acid reflux symptoms since age 68 treated with Famotidine and chronic diarrhea since childhood. No dysphagia. Passing nonbloody mud to watery diarrhea stools 2 to 3 times daily and sometimes up to 15 episodes of diarrhea daily. Diarrhea was not worse s/p cholecystectomy. She has variable abdominal pain described as an upside down U pattern, starts from the mid right upper abdomen across the upper abdomen and to the left mid abdomen which can last for several hours or all day. No significant pain at night time. She has urgent diarrhea stools that come and go, sometimes feels like she needs to pass a BM but can't.  Sometimes her diarrhea and abdominal pain can last for a few days or for 1 to 2 months then diminishes for a few weeks. She takes Dicyclomine as needed for abdominal pain with minimal relief. She sometimes sees a small amount of bright red blood in the toilet water mixed with the diarrhea if she's passed numerous BMs. She takes Motrin 200mg  two tabs once weekly or less for abdominal  pain. She underwent 2 colonoscopies and one flexible sigmoidoscopy when living in [place]. Her most recent flexible sigmoidoscopy was 06/12/2017 which showed some inflammation to the left colon. Colonoscopy 11/04/2014 showed hemorrhoids. She had diarrhea for many years prior to starting Metformin for PCOS and prediabetes.  She was off metformin for less than 1 year then restarted it 1 week ago and she is feeling more gassy.  Her diarrhea and abdominal pain did not improve when she went on a gluten diet for a few weeks. Great grandfather had colon cancer. No family history of IBD.    She underwent an EGD  06/12/2017 and 11/04/2014 which showed gastritis. No known history of celiac disease.    She has significant nausea with uncontrollable vomiting with general anesthesia. No problems with the sedation used during her past EGDs and colonoscopies. She has a history of anxiety and depression for which she sees a therapist once weekly and a psychiatrist once monthly.     History of afib in setting of severe hypothyroidism and "one week later" she underwent a cholecystectomy. She was apparently on Pradaxa for a brief period of time. No further obvious afib since she underwent a cholecystectomy. No longer followed by cardiology.    PAST GI PROCEDURES:    EGD 11/04/2014: Esophagus: The esophagus was normal. The EG junction appeared normal, as well. There was no evidence of erythema or ulcers. Stomach: The gastric antrum had mild mucosal erythema and was biopsied. The cardia, fundus and body were unremarkable. The retroflexed view of the fundus was unremarkable. Duodenum: The duodenal  bulb was normal. The second portion of the duodenum was normal but was biopsied for celiac sprue   Colonoscopy 11/04/2014: Cecum: Normal Ileocecal valve: Normal Ascending colon: Normal, the right colon was biopsied to rule out microscopic colitis Transverse colon: Normal Descending colon: Normal Sigmoid colon: Normal, the  left colon was biopsied to rule out microscopic colitis Rectum: Internal hemorrhoids otherwise normal including retroflexion. Digital exam: Normal   EGD 06/12/2017: Esophagus: The esophagus was normal. The Z-line was regular. Stomach: The gastric antrum and body were mildly erythematous. The retroflexed view of the fundus was unremarkable. Duodenum: The duodenal bulb was normal. The second portion of the duodenum was normal.   Sigmoidoscopy 06/12/2017: Cecum: Not seen Ileocecal valve: Not seen Ascending colon: Not seen Transverse colon: Not seen Descending colon: Normal Sigmoid colon: Normal Rectum: Internal hemorrhoids otherwise normal including retroflexion. Digital exam: Normal Recommendations:  Rectal bleeding likely from internal hemorrhoids-no sigmoid mass.   Note, pathology reports unavailable in care everywhere, result attachments  were not available    ECHO 06/11/2021: 1. Left ventricle: The cavity size is normal. Normal thickness is present.     There are no regional wall motion abnormalities present. The left     ventricular ejection fraction is normal. The LVEF was determined by     visual estimate. Left ventricular ejection fraction is 55-60%. The left     ventricular filling pattern is normal. There is no evidence of diastolic     dysfunction present.  2. Right ventricle: The cavity size is normal. Right ventricular systolic     function is normal.  3. Mitral valve: The leaflets are mildly thickened. There is mild (1+)     mitral valve regurgitation.  4. Aortic valve: The valve is trileaflet. There is no aortic stenosis. There     is no valvular aortic regurgitation.  5. Tricuspid valve: The valve is structurally normal. The tricuspid leaflets     are not thickened. There is mild (1+) tricuspid valve regurgitation.  6. Pericardium, extracardiac: There is no pericardial effusion.   Current Outpatient Medications on File Prior to Visit  Medication Sig Dispense Refill    albuterol (PROVENTIL) (2.5 MG/3ML) 0.083% nebulizer solution Take 3 mLs (2.5 mg total) by nebulization every 6 (six) hours as needed for wheezing or shortness of breath. 150 mL 1   albuterol (VENTOLIN HFA) 108 (90 Base) MCG/ACT inhaler INHALE 2 PUFFS BY MOUTH EVERY 4 HOURS AS NEEDED FOR WHEEZING OR SHORTNESS OF BREATH 18 g 1   Coenzyme Q10 (COQ10 PO) Take 200 mg by mouth daily.     Cyanocobalamin 1000 MCG SUBL PLACE 1 TABLET(S) EVERY DAY BY SUBLINGUAL ROUTE.     dicyclomine (BENTYL) 20 MG tablet Take 1 tablet (20 mg total) by mouth every 6 (six) hours as needed for spasms. 90 tablet 1   DULoxetine (CYMBALTA) 20 MG capsule Take 20 mg by mouth daily.     fexofenadine (ALLEGRA) 180 MG tablet      folic acid (FOLVITE) 1 MG tablet Take 1 tablet (1 mg total) by mouth daily. 90 tablet 1   medroxyPROGESTERone (PROVERA) 10 MG tablet Take 10 mg by mouth as needed.     metFORMIN (GLUCOPHAGE-XR) 500 MG 24 hr tablet Take 1 tablet (500 mg total) by mouth daily with breakfast. 90 tablet 1   omeprazole (PRILOSEC) 20 MG capsule Take 20 mg by mouth at bedtime.     OVER THE COUNTER MEDICATION Take 1,320 mg by mouth every 30 (thirty) days. Caprylic Acid  propranolol (INDERAL) 40 MG tablet Take 1 tablet (40 mg total) by mouth 2 (two) times daily. **paitent needs to be seen in office for additional refills.** 180 tablet 1   rosuvastatin (CRESTOR) 20 MG tablet Take 1 tablet (20 mg total) by mouth daily. 90 tablet 3   No current facility-administered medications on file prior to visit.   Allergies  Allergen Reactions   Aminophylline Anaphylaxis   Cefaclor Anaphylaxis   Cephalosporins Anaphylaxis   Diphenhydramine Anaphylaxis   Sulfa Antibiotics Anaphylaxis   Clarithromycin    Iodinated Contrast Media    Iodine     Other reaction(s): Unknown   Current Medications, Allergies, Past Medical History, Past Surgical History, Family History and Social History were reviewed in [employer]  record.  Review of Systems:   Constitutional: Negative for fever, sweats, chills or weight loss.  Respiratory: Negative for shortness of breath.   Cardiovascular: Negative for chest pain, palpitations and leg swelling.  Gastrointestinal: See HPI.  Musculoskeletal: Negative for back pain or muscle aches.  Neurological: Negative for dizziness, headaches or paresthesias.    Physical Exam: BP 128/72   Pulse 79   Ht 5\' 4"  (1.626 m)   Wt (!) 302 lb 9.6 oz (137.3 kg)   SpO2 95%   BMI 51.94 kg/m  Wt Readings from Last 3 Encounters:  08/21/23 (!) 302 lb 9.6 oz (137.3 kg)  08/14/23 (!) 305 lb 6.4 oz (138.5 kg)  04/03/23 299 lb (135.6 kg)    General: 37 year old obese female in no acute distress. Head: Normocephalic and atraumatic. Eyes: No scleral icterus. Conjunctiva pink . Ears: Normal auditory acuity. Mouth: Dentition intact. No ulcers or lesions.  Lungs: Clear throughout to auscultation. Heart: Regular rate and rhythm, no murmur. Abdomen: Soft, nondistended.  Generalized tenderness without rebound or guarding.  No masses or hepatomegaly. Normal bowel sounds x 4 quadrants.  Rectal: Deferred.  Musculoskeletal: Symmetrical with no gross deformities. Extremities: No edema. Neurological: Alert oriented x 4. No focal deficits.  Psychological: Alert and cooperative. Normal mood and affect  Assessment and Recommendations:  37 year old female with chronic diarrhea with intermittent urgency and generalized abdominal pain, more upper/mid than lower abdominal pain. Colonoscopy in 2015 and flex sig in 2018 showed hemorrhoids. No colitis.  -EGD and colonoscopy at [facility] (BMI 51) benefits and risks discussed including risk with sedation, risk of bleeding, perforation and infection  -Omeprazole 20 mg daily -GERD diet -Dicyclomine 20 mg 1 p.o. every 68 hours as needed -Consider changing Metformin to extended release, defer to PCP/GYN (Metformin secondary to prediabetes and PCOS)   Rectal  bleeding, intermittent. History of hemorrhoids.  -See plan in # 1   GERD. EGD in 2015 and 2018 showed gastritis.  No longer taking Famotidine. -See plan in # 1 -GERD diet  -Eventual referral to [facility]    Paroxysmal atrial fib x 1 episode, resolved after laparoscopic cholecystectomy 03/2017

## 2023-08-22 ENCOUNTER — Encounter: Payer: Self-pay | Admitting: Nurse Practitioner

## 2023-08-29 ENCOUNTER — Ambulatory Visit (HOSPITAL_COMMUNITY)
Admission: RE | Admit: 2023-08-29 | Discharge: 2023-08-29 | Disposition: A | Discharge status: Home / Self Care | Payer: Medicaid Other | Source: Ambulatory Visit | Attending: Cardiology | Admitting: Cardiology

## 2023-08-29 DIAGNOSIS — I6522 Occlusion and stenosis of left carotid artery: Secondary | ICD-10-CM | POA: Diagnosis not present

## 2023-09-01 ENCOUNTER — Other Ambulatory Visit (HOSPITAL_BASED_OUTPATIENT_CLINIC_OR_DEPARTMENT_OTHER): Payer: Self-pay

## 2023-09-01 DIAGNOSIS — I6521 Occlusion and stenosis of right carotid artery: Secondary | ICD-10-CM

## 2023-09-03 DIAGNOSIS — F331 Major depressive disorder, recurrent, moderate: Secondary | ICD-10-CM | POA: Diagnosis not present

## 2023-09-03 DIAGNOSIS — F411 Generalized anxiety disorder: Secondary | ICD-10-CM | POA: Diagnosis not present

## 2023-09-03 DIAGNOSIS — F900 Attention-deficit hyperactivity disorder, predominantly inattentive type: Secondary | ICD-10-CM | POA: Diagnosis not present

## 2023-09-10 ENCOUNTER — Encounter: Payer: Self-pay | Admitting: Orthopedic Surgery

## 2023-09-11 DIAGNOSIS — F411 Generalized anxiety disorder: Secondary | ICD-10-CM | POA: Diagnosis not present

## 2023-09-11 DIAGNOSIS — F331 Major depressive disorder, recurrent, moderate: Secondary | ICD-10-CM | POA: Diagnosis not present

## 2023-09-17 DIAGNOSIS — F900 Attention-deficit hyperactivity disorder, predominantly inattentive type: Secondary | ICD-10-CM | POA: Diagnosis not present

## 2023-09-17 DIAGNOSIS — F411 Generalized anxiety disorder: Secondary | ICD-10-CM | POA: Diagnosis not present

## 2023-09-17 DIAGNOSIS — F331 Major depressive disorder, recurrent, moderate: Secondary | ICD-10-CM | POA: Diagnosis not present

## 2023-09-18 ENCOUNTER — Encounter: Payer: Self-pay | Admitting: Orthopedic Surgery

## 2023-09-18 ENCOUNTER — Ambulatory Visit (INDEPENDENT_AMBULATORY_CARE_PROVIDER_SITE_OTHER): Payer: Medicaid Other | Admitting: Orthopedic Surgery

## 2023-09-18 VITALS — BP 124/88 | HR 99 | Temp 97.7°F | Resp 17 | Ht 64.0 in | Wt 309.6 lb

## 2023-09-18 DIAGNOSIS — F4321 Adjustment disorder with depressed mood: Secondary | ICD-10-CM

## 2023-09-18 DIAGNOSIS — I4819 Other persistent atrial fibrillation: Secondary | ICD-10-CM | POA: Diagnosis not present

## 2023-09-18 DIAGNOSIS — Z6841 Body Mass Index (BMI) 40.0 and over, adult: Secondary | ICD-10-CM

## 2023-09-18 DIAGNOSIS — I10 Essential (primary) hypertension: Secondary | ICD-10-CM

## 2023-09-18 DIAGNOSIS — R7303 Prediabetes: Secondary | ICD-10-CM | POA: Diagnosis not present

## 2023-09-18 DIAGNOSIS — M797 Fibromyalgia: Secondary | ICD-10-CM | POA: Diagnosis not present

## 2023-09-18 DIAGNOSIS — K580 Irritable bowel syndrome with diarrhea: Secondary | ICD-10-CM | POA: Diagnosis not present

## 2023-09-18 MED ORDER — DULOXETINE HCL 30 MG PO CPEP
30.0000 mg | ORAL_CAPSULE | Freq: Every day | ORAL | Status: DC
Start: 2023-09-18 — End: 2023-11-10

## 2023-09-18 NOTE — Progress Notes (Signed)
Careteam: Patient Care Team: Phyllis Heir, NP as PCP - General (Adult Health Nurse Practitioner) Jodelle Red, MD as PCP - Cardiology (Cardiology) [name], NP as Nurse Practitioner (Obstetrics and Gynecology) Huel Cote, MD as Consulting Physician (Orthopedic Surgery) Jodelle Red, MD as Consulting Physician (Cardiology)  Seen by: Hazle Nordmann, AGNP-C  PLACE OF SERVICE:  [facility]  Advanced Directive information Does Patient Have a Medical Advance Directive?: No, Would patient like information on creating a medical advance directive?: No - Patient declined  Allergies  Allergen Reactions   Aminophylline Anaphylaxis   Cefaclor Anaphylaxis   Cephalosporins Anaphylaxis   Diphenhydramine Anaphylaxis   Sulfa Antibiotics Anaphylaxis   Clarithromycin    Iodinated Contrast Media    Iodine     Other reaction(s): Unknown    Chief Complaint  Patient presents with   Follow-up    New patient 4 week follow up.      HPI: Patient is a 37 y.o. female seen today for medical management of chronic conditions.   A1c 6.4 (08/22) > was 6.2 (03/2023). Recently started on metformin, tolerating well.   EGD scheduled for 10/2023. Continues to have abdominal cramping. Using dicyclomine prn, averaging 1-2x/month.   Weight up 7 lbs. Was not contacted by [facility]. She recently moved and admits to not eating healthy.   Seeing therapist, grieving mother who passed 06/2023. Cymbalta was recently increased by therapist to 30 mg.   Continues to have generalized pain. Right ankle has been most bothersome lately.   Review of Systems:  Review of Systems  Constitutional: Negative.   HENT: Negative.    Eyes: Negative.   Respiratory: Negative.    Cardiovascular: Negative.   Gastrointestinal:  Positive for abdominal pain.  Genitourinary: Negative.   Musculoskeletal:  Positive for joint pain and myalgias.  Skin: Negative.   Neurological: Negative.    Psychiatric/Behavioral:  Positive for depression.     Past Medical History:  Diagnosis Date   A-fib (HCC)    Anemia    Anxiety    Arthritis    Asthma    Depression    Fibromyalgia    GERD (gastroesophageal reflux disease)    High cholesterol    Hypertension    IBS (irritable bowel syndrome)    Lymphedema    Obesity    PCOS (polycystic ovarian syndrome)    Prediabetes    Past Surgical History:  Procedure Laterality Date   CHOLECYSTECTOMY     DILATION AND CURETTAGE OF UTERUS     GALLBLADDER SURGERY  2018   Social History:   reports that she has never smoked. She has been exposed to tobacco smoke. She has never used smokeless tobacco. She reports that she does not currently use alcohol. She reports that she does not currently use drugs.  Family History  Problem Relation Age of Onset   Dementia Mother    Breast cancer Mother        onset 35, 2nd 2016   Diabetes Mother    Heart disease Father    Diabetes Father    Irritable bowel syndrome Father    Breast cancer Maternal Aunt        onset 75   Breast cancer Other        maternal first cousin, onset 69   Colon cancer Maternal Great-grandfather    Esophageal cancer Neg Hx    Stomach cancer Neg Hx     Medications: Patient's Medications  New Prescriptions   No  medications on file  Previous Medications   ALBUTEROL (PROVENTIL) (2.5 MG/3ML) 0.083% NEBULIZER SOLUTION    Take 3 mLs (2.5 mg total) by nebulization every 6 (six) hours as needed for wheezing or shortness of breath.   ALBUTEROL (VENTOLIN HFA) 108 (90 BASE) MCG/ACT INHALER    INHALE 2 PUFFS BY MOUTH EVERY 4 HOURS AS NEEDED FOR WHEEZING OR SHORTNESS OF BREATH   COENZYME Q10 (COQ10 PO)    Take 200 mg by mouth daily.   CYANOCOBALAMIN 1000 MCG SUBL    PLACE 1 TABLET(S) EVERY DAY BY SUBLINGUAL ROUTE.   DICYCLOMINE (BENTYL) 20 MG TABLET    Take 1 tablet (20 mg total) by mouth every 6 (six) hours as needed for spasms.   DULOXETINE (CYMBALTA) 20 MG CAPSULE    Take 20  mg by mouth daily.   FEXOFENADINE (ALLEGRA) 180 MG TABLET       FOLIC ACID (FOLVITE) 1 MG TABLET    Take 1 tablet (1 mg total) by mouth daily.   MEDROXYPROGESTERONE (PROVERA) 10 MG TABLET    Take 10 mg by mouth as needed.   METFORMIN (GLUCOPHAGE-XR) 500 MG 24 HR TABLET    Take 1 tablet (500 mg total) by mouth daily with breakfast.   OMEPRAZOLE (PRILOSEC) 20 MG CAPSULE    Take 20 mg by mouth at bedtime.   OVER THE COUNTER MEDICATION    Take 1,320 mg by mouth every 30 (thirty) days. Caprylic Acid   PROPRANOLOL (INDERAL) 40 MG TABLET    Take 1 tablet (40 mg total) by mouth 2 (two) times daily. **paitent needs to be seen in office for additional refills.**   ROSUVASTATIN (CRESTOR) 20 MG TABLET    Take 1 tablet (20 mg total) by mouth daily.  Modified Medications   No medications on file  Discontinued Medications   No medications on file    Physical Exam:  Vitals:   09/18/23 0928  BP: 124/88  Pulse: 99  Resp: 17  Temp: 97.7 F (36.5 C)  SpO2: 97%  Weight: (!) 309 lb 9.6 oz (140.4 kg)  Height: 5\' 4"  (1.626 m)   Body mass index is 53.14 kg/m. Wt Readings from Last 3 Encounters:  09/18/23 (!) 309 lb 9.6 oz (140.4 kg)  08/21/23 (!) 302 lb 9.6 oz (137.3 kg)  08/14/23 (!) 305 lb 6.4 oz (138.5 kg)    Physical Exam Vitals reviewed.  Constitutional:      General: She is not in acute distress.    Appearance: She is obese.  HENT:     Head: Normocephalic.     Right Ear: There is no impacted cerumen.     Left Ear: There is no impacted cerumen.     Nose: Nose normal.     Mouth/Throat:     Mouth: Mucous membranes are moist.  Eyes:     General:        Right eye: No discharge.        Left eye: No discharge.  Neck:     Thyroid: No thyroid mass or thyromegaly.  Cardiovascular:     Rate and Rhythm: Normal rate and regular rhythm.     Pulses: Normal pulses.     Heart sounds: Normal heart sounds.  Pulmonary:     Effort: Pulmonary effort is normal.     Breath sounds: Normal breath  sounds.  Abdominal:     General: Bowel sounds are normal.     Palpations: Abdomen is soft.  Musculoskeletal:  Cervical back: Neck supple.     Right lower leg: No edema.     Left lower leg: No edema.  Lymphadenopathy:     Cervical: No cervical adenopathy.  Skin:    General: Skin is warm.     Capillary Refill: Capillary refill takes less than 2 seconds.  Neurological:     General: No focal deficit present.     Mental Status: She is alert and oriented to person, place, and time.  Psychiatric:        Mood and Affect: Mood normal.     Labs reviewed: Basic Metabolic Panel: Recent Labs    01/30/23 2312 03/27/23 0946 08/14/23 1012  NA 134* 138 138  K 3.6 4.6 4.7  CL 100 99 102  CO2 26 24 30   GLUCOSE 154* 101* 93  BUN 11 9 12   CREATININE 0.73 0.63 0.72  CALCIUM 9.1 9.7 9.4  TSH 3.053 1.930  --    Liver Function Tests: Recent Labs    01/30/23 2312 03/27/23 0946 08/14/23 1012  AST 21 14 14   ALT 17 16 12   ALKPHOS 72 113  --   BILITOT 0.5 0.4 0.4  PROT 7.0 7.6 7.3  ALBUMIN 3.5 4.3  --    Recent Labs    01/30/23 2312  LIPASE 39   No results for input(s): "AMMONIA" in the last 8760 hours. CBC: Recent Labs    01/30/23 2312 03/27/23 0946 08/14/23 1012  WBC 9.7 7.7 8.9  NEUTROABS 6.6  --  5,687  HGB 12.4 13.0 12.3  HCT 39.6 40.6 38.8  MCV 85.2 83 84.2  PLT 395 473* 464*   Lipid Panel: Recent Labs    03/27/23 0946 08/14/23 1012  CHOL 162 128  HDL 47 45*  LDLCALC 93 65  TRIG 122 92  CHOLHDL 3.4 2.8   TSH: Recent Labs    01/30/23 2312 03/27/23 0946  TSH 3.053 1.930   A1C: Lab Results  Component Value Date   HGBA1C 6.4 (H) 08/14/2023     Assessment/Plan 1. Prediabetes - recent A1c 6.4 - now on metformin> tolerating well - foot exam today> normal - on statin - consider low dose ACE/ARB next visit for kidney protection  2. Persistent atrial fibrillation (HCC) - HR< 100 with propranolol  3. Primary hypertension - controlled, goal <  140/80 - cont propranolol  4. BMI 50.0-59.9, adult (HCC) - BMI 53.14 - weight gain 7 lbs since last visit - consider GLP-1 in future - referral sent to [facility]> advised to call for appointment - recommend light exercise 150 min/week - avoid prolonged sitting - discussed limiting calories  5. Grieving - mother passed 06/2023 - followed by psychiatry - improved mood with recent Cymbalta increase - DULoxetine (CYMBALTA) 30 MG capsule; Take 1 capsule (30 mg total) by mouth daily.  6. Fibromyalgia - ongoing - on Cymbalta  7. Irritable bowel syndrome - EGD scheduled 10/2023 - cont dicyclomine prn> averaging 1-2x/month  Total time: 32 minutes. Greater than 50% of total time spent doing patient education regarding health maintenance, prediabetes, HTN, abdominal pain and IBS including symptom/medication management.   Next appt: 11/27/2023  Hazle Nordmann, Juel Burrow  [facility] [phone]

## 2023-09-18 NOTE — Patient Instructions (Signed)
 [facility] at [facility] [address] [place]. [place],  [place]  [address] [phone]

## 2023-09-19 ENCOUNTER — Other Ambulatory Visit (HOSPITAL_BASED_OUTPATIENT_CLINIC_OR_DEPARTMENT_OTHER): Payer: Self-pay | Admitting: Cardiology

## 2023-09-19 DIAGNOSIS — E782 Mixed hyperlipidemia: Secondary | ICD-10-CM

## 2023-09-19 DIAGNOSIS — I6522 Occlusion and stenosis of left carotid artery: Secondary | ICD-10-CM

## 2023-09-24 ENCOUNTER — Ambulatory Visit (INDEPENDENT_AMBULATORY_CARE_PROVIDER_SITE_OTHER): Payer: Medicaid Other | Admitting: Radiology

## 2023-09-24 ENCOUNTER — Encounter: Payer: Self-pay | Admitting: Radiology

## 2023-09-24 VITALS — BP 112/76 | Temp 98.7°F

## 2023-09-24 DIAGNOSIS — R102 Pelvic and perineal pain: Secondary | ICD-10-CM | POA: Diagnosis not present

## 2023-09-24 DIAGNOSIS — N914 Secondary oligomenorrhea: Secondary | ICD-10-CM | POA: Diagnosis not present

## 2023-09-24 MED ORDER — NORETHINDRONE 0.35 MG PO TABS
1.0000 | ORAL_TABLET | Freq: Every day | ORAL | 0 refills | Status: DC
Start: 2023-09-24 — End: 2023-12-09

## 2023-09-24 NOTE — Progress Notes (Signed)
   Phyllis Gentry [facility] 1986-10-26 [id number]   History:  37 y.o. G0 presents with c/o dysmenorrhea and occasional pelvic cramping. She originally through cramping was GI related but had similar pain with her sonohystogram last October and since correlated the two. Hx of PCOS with oligomenorrhea, uses provera every 3 months as needed to induce period. No new medical problems. No other associated symptoms. No new sexual partners.  Gynecologic History Patient's last menstrual period was 07/22/2023 (exact date).   Contraception/Family planning:  no sperm exposure Sexually active: yes Last Pap: 2023 NIL/HPV+   Obstetric History OB History  Gravida Para Term Preterm AB Living  0 0 0 0 0 0  SAB IAB Ectopic Multiple Live Births  0 0 0 0 0     The following portions of the patient's history were reviewed and updated as appropriate: allergies, current medications, past family history, past medical history, past social history, past surgical history, and problem list.  Review of Systems Pertinent items noted in HPI and remainder of comprehensive ROS otherwise negative.   Past medical history, past surgical history, family history and social history were all reviewed and documented in the EPIC chart.   Exam:  Vitals:   09/24/23 1055  BP: 112/76  Temp: 98.7 F (37.1 C)  TempSrc: Oral   There is no height or weight on file to calculate BMI.  General appearance:  Normal, NAD, obese Genitourinary   Inguinal/mons:  Normal without inguinal adenopathy  External genitalia:  Normal appearing vulva with no masses, tenderness, or lesions  BUS/Urethra/Skene's glands:  Normal without masses or exudate  Vagina:  Normal appearing with normal color and discharge, no lesions  Cervix:  Normal appearing without discharge or lesions  Uterus:  Normal in size, shape and contour.  Mobile, nontender  Adnexa/parametria:     Rt: Normal in size, without masses or tenderness.   Lt: Normal in size, without  masses or tenderness.  Anus and perineum: Normal   Phyllis Gentry, CMA present for exam  Assessment/Plan:   1. Pelvic pain - Pregnancy, urine; neg - Urinalysis,Complete w/RFL Culture - norethindrone (ORTHO MICRONOR) 0.35 MG tablet; Take 1 tablet (0.35 mg total) by mouth daily.  Dispense: 84 tablet; Refill: 0  2. Secondary oligomenorrhea Not a candidate for estrogen therapy based on cardiac history and family breast CA hx. Will plan to switch from prn provera to micronor to prevent dymsmenorrhea - norethindrone (ORTHO MICRONOR) 0.35 MG tablet; Take 1 tablet (0.35 mg total) by mouth daily.  Dispense: 84 tablet; Refill: 0    Follow up 3 months for med check and AEX   Phyllis Gentry B WHNP-BC 11:26 AM 09/24/2023

## 2023-09-26 ENCOUNTER — Ambulatory Visit: Payer: Medicaid Other | Admitting: Nurse Practitioner

## 2023-09-26 ENCOUNTER — Ambulatory Visit: Payer: Medicaid Other | Admitting: Family Medicine

## 2023-09-26 LAB — CULTURE INDICATED

## 2023-09-26 LAB — URINE CULTURE
MICRO NUMBER:: 15541871
SPECIMEN QUALITY:: ADEQUATE

## 2023-09-26 LAB — PREGNANCY, URINE: Preg Test, Ur: NEGATIVE

## 2023-09-26 LAB — URINALYSIS, COMPLETE W/RFL CULTURE
Bilirubin Urine: NEGATIVE
Glucose, UA: NEGATIVE
Hyaline Cast: NONE SEEN /[LPF]
Ketones, ur: NEGATIVE
Leukocyte Esterase: NEGATIVE
Nitrites, Initial: NEGATIVE
Protein, ur: NEGATIVE
Specific Gravity, Urine: 1.02 (ref 1.001–1.035)
pH: 7 (ref 5.0–8.0)

## 2023-10-01 DIAGNOSIS — F331 Major depressive disorder, recurrent, moderate: Secondary | ICD-10-CM | POA: Diagnosis not present

## 2023-10-01 DIAGNOSIS — F900 Attention-deficit hyperactivity disorder, predominantly inattentive type: Secondary | ICD-10-CM | POA: Diagnosis not present

## 2023-10-01 DIAGNOSIS — F411 Generalized anxiety disorder: Secondary | ICD-10-CM | POA: Diagnosis not present

## 2023-10-06 NOTE — Progress Notes (Signed)
Office Visit Note  Patient: Phyllis Gentry             Date of Birth: 1986/11/05           MRN: [id number]             PCP: Octavia Heir, NP Referring: Carlean Jews, NP Visit Date: 10/07/2023   Subjective:  Follow-up (Patient states she has been having a lot more left ankle pain. Patient states her both of her thumbs have been bothering her. )   History of Present Illness: Phyllis Gentry is a 37 y.o. female here for follow up for joint pain in multiple areas possible psoriatic arthritis and chronic myofascial pain.  Testing at initial visit showed mildly elevated sedimentation rate and CRP otherwise unremarkable.  Continues having joint pain and stiffness in multiple areas worst affected joints in her left ankle and in bilateral thumbs.  Also with muscle pain in her neck and back which is chronic.  She does see swelling in the left ankle comes and goes intermittently.  Has severe stiffness in her calves sometimes with cramping sensation.  Cymbalta was increased from 20 to 30 mg daily.  She took a course of antibiotics for a skin boil in her groin area.  Has been about 6 months since she had any skin infection like this sometimes gets them in the axilla and sometimes in the groin.  No other new rashes on her extremities and no nail changes.  Previous HPI 04/03/23 Phyllis Gentry is a 37 y.o. female here for multiple joint pains including left wrist inflammation osteoarthritis, and history of possible psoriatic arthritis and fibromyalgia syndrome.  She has a history of joint pain issues dating back to early childhood with extensive early glucocorticoid exposure due to severe allergy and hypersensitivity problems.  This has contributed to early issues with some carotid artery stenosis, and obesity with PCOS and some early degenerative arthritis changes.  She previously saw rheumatology in 2021 with iniital treatment on NSAIDs. Lab workup at that time showing elevated CRP and  platelets with negative ANA testing and no radiographic sacroiliitis.  She has generally had some degree of ongoing joint pains typically affecting her neck and back and knees and ankles somewhat varying severity.  Knee pain worse on the right side and often provoked with climbing stairs or getting up from the ground or low furniture positions.  Also tends to have increase in neck and hand pains towards the end of the day.  Has had bilateral carpal tunnel syndrome but does not require any interventions for this and no complications with excessive weakness dropping items or difficulty performing routine tasks.  Referral to this visit was associated with development of significant left wrist pain and swelling with findings of tenosynovitis and orthopedics clinic.  She did not recall any particular injury activity change or illness preceding onset of symptoms started around late January or beginning of February.  Since the left wrist pain improved she is not having large amounts of visible joint swelling although still has some persistent pain and stiffness.  She takes some Tylenol and ibuprofen as needed but not every day and usually not multiple times per day. Skin disease experiences episodic small patchy rashes usually resolving without any specific long-term treatment.  These were previously suspected as psoriasis but never on a long-term medication.  Currently skin is pretty clear did not see a flareup of rash associated with the left wrist swelling.  With previous  rheumatology evaluation her findings with irregular brittle and thin fingernails with frequent splitting were suspected as related to possible psoriatic disease. Fibromyalgia with frequent pain including muscular stiffness and pain throughout the that stays pretty constantly present.  Usually does not feel widespread sensitivity all over or excessive pain with benign stimuli.  Does have issues with silent migraine pretty often.  Has irritable bowel  symptoms predominantly diarrhea.  She was started on Cymbalta takes 20 mg daily feels this is partially beneficial for her body aches.   Review of Systems  Constitutional:  Positive for fatigue.  HENT:  Negative for mouth sores and mouth dryness.   Eyes:  Positive for dryness.  Respiratory:  Negative for shortness of breath.   Cardiovascular:  Positive for palpitations. Negative for chest pain.  Gastrointestinal:  Positive for diarrhea. Negative for blood in stool and constipation.  Endocrine: Negative for increased urination.  Genitourinary:  Negative for involuntary urination.  Musculoskeletal:  Positive for joint pain, joint pain, joint swelling, myalgias, muscle weakness, morning stiffness, muscle tenderness and myalgias. Negative for gait problem.  Skin:  Negative for color change, rash, hair loss and sensitivity to sunlight.  Allergic/Immunologic: Positive for susceptible to infections.  Neurological:  Positive for dizziness and headaches.  Hematological:  Negative for swollen glands.  Psychiatric/Behavioral:  Positive for depressed mood. Negative for sleep disturbance. The patient is nervous/anxious.     PMFS History:  Patient Active Problem List   Diagnosis Date Noted   High risk medication use 10/07/2023   Other fatigue 04/25/2023   Mixed hyperlipidemia 04/25/2023   Prediabetes 04/25/2023   Vitamin D deficiency 04/25/2023   Myofascial pain syndrome 04/03/2023   Patellofemoral pain syndrome of right knee 04/03/2023   Abscess of skin and subcutaneous tissue 10/13/2022   Carotid artery stenosis, asymptomatic, left 09/18/2022   Generalized anxiety disorder 04/18/2022   Menstrual periods irregular 03/03/2022   Anemia due to folic acid deficiency 03/03/2022   Recurrent major depressive disorder, in partial remission (HCC) 03/03/2022   Class 3 severe obesity due to excess calories without serious comorbidity with body mass index (BMI) of 50.0 to 59.9 in adult [facility]) 02/15/2022    Irritable bowel syndrome with diarrhea 01/27/2022   H/O atrial fibrillation without current medication 01/27/2022   Lymphedema 01/27/2022   Polycystic ovary disease 01/27/2022   BMI 50.0-59.9, adult (HCC) 01/27/2022   Fibromyalgia 01/25/2022   Hypertension 01/25/2022   Atrial fibrillation (HCC) 07/07/2021   Anxiety 07/07/2021   Asthma 07/07/2021   Psoriatic arthritis (HCC) 02/10/2021   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, right 10/31/2020   Dizziness 07/10/2020   Pure hypercholesterolemia 07/10/2020   Hereditary lymphedema 07/05/2019   Biliary dyskinesia 04/16/2017   Endometrial polyp 08/27/2016   Menometrorrhagia 08/27/2016   Cervical radiculitis 10/30/2015   Carpal tunnel syndrome 07/03/2015    Past Medical History:  Diagnosis Date   A-fib (HCC)    Anemia    Anxiety    Arthritis    Asthma    Depression    Fibromyalgia    GERD (gastroesophageal reflux disease)    High cholesterol    Hypertension    IBS (irritable bowel syndrome)    Lymphedema    Obesity    PCOS (polycystic ovarian syndrome)    Prediabetes     Family History  Problem Relation Age of Onset   Dementia Mother    Breast cancer Mother        onset 40, 2nd 2016   Diabetes Mother  Alzheimer's disease Mother    Heart disease Father    Diabetes Father    Irritable bowel syndrome Father    Breast cancer Maternal Aunt        onset 70   Colon cancer Maternal Great-grandfather    Breast cancer Other        maternal first cousin, onset 26   Esophageal cancer Neg Hx    Stomach cancer Neg Hx    Past Surgical History:  Procedure Laterality Date   CHOLECYSTECTOMY     DILATION AND CURETTAGE OF UTERUS     GALLBLADDER SURGERY  2018   Social History   Social History Narrative   Tobacco use, amount per day now: No   Past tobacco use, amount per day: No   How many years did you use tobacco: No   Alcohol use (drinks per week): No   Diet:   Do you drink/eat things with caffeine: Yes   Marital  status:   Married                               What year were you married? 2022   Do you live in a house, apartment, assisted living, condo, trailer, etc.? Apartment   Is it one or more stories? 3   How many persons live in your home? 2   Do you have pets in your home?( please list) 2 Cats, 1 Dog   Highest Level of education completed? Bachelors Degree   Current or past profession: Art    Do you exercise?       No                           Type and how often?   Do you have a living will? No   Do you have a DNR form?    No                               If not, do you want to discuss one?   Do you have signed POA/HPOA forms No                        If so, please bring to you appointment      Do you have any difficulty bathing or dressing yourself? No   Do you have any difficulty preparing food or eating? No   Do you have any difficulty managing your medications? No   Do you have any difficulty managing your finances? No   Do you have any difficulty affording your medications? No    There is no immunization history on file for this patient.   Objective: Vital Signs: BP 138/85 (BP Location: Left Arm, Patient Position: Sitting, Cuff Size: Normal)   Pulse 91   Resp 14   Ht 5\' 4"  (1.626 m)   Wt (!) 305 lb (138.3 kg)   LMP 07/22/2023 (Exact Date)   BMI 52.35 kg/m    Physical Exam Constitutional:      Appearance: She is obese.  Eyes:     Conjunctiva/sclera: Conjunctivae normal.  Cardiovascular:     Rate and Rhythm: Normal rate and regular rhythm.  Pulmonary:     Effort: Pulmonary effort is normal.     Breath sounds: Normal breath sounds.  Lymphadenopathy:     Cervical:  No cervical adenopathy.  Skin:    General: Skin is warm and dry.  Neurological:     Mental Status: She is alert.  Psychiatric:        Mood and Affect: Mood normal.      Musculoskeletal Exam:  Tenderness to pressure at cervical paraspinal muscles and along medial border of scapulae, tender across trapezius  muscles Wrists full ROM no tenderness or swelling Fingers full ROM, first MCP joint tenderness to pressure no palpable swelling Knees full ROM no tenderness or swelling Ankles full ROM tenderness to pressure at Achilles tendon insertion, no palpable swelling   Investigation: No additional findings.  Imaging: No results found.  Recent Labs: Lab Results  Component Value Date   WBC 8.9 08/14/2023   HGB 12.3 08/14/2023   PLT 464 (H) 08/14/2023   NA 138 08/14/2023   K 4.7 08/14/2023   CL 102 08/14/2023   CO2 30 08/14/2023   GLUCOSE 93 08/14/2023   BUN 12 08/14/2023   CREATININE 0.72 08/14/2023   BILITOT 0.4 08/14/2023   ALKPHOS 113 03/27/2023   AST 14 08/14/2023   ALT 12 08/14/2023   PROT 7.3 08/14/2023   ALBUMIN 4.3 03/27/2023   CALCIUM 9.4 08/14/2023    Speciality Comments: No specialty comments available.  Procedures:  No procedures performed Allergies: Aminophylline, Cefaclor, Cephalosporins, Diphenhydramine, Sulfa antibiotics, Clarithromycin, Iodinated contrast media, and Iodine   Assessment / Plan:     Visit Diagnoses: Psoriatic arthritis (HCC) - Plan: Sedimentation rate, C-reactive protein  Mildly elevated inflammatory markers but difficult to say for certain about psoriatic arthritis causing current symptoms without appreciable peripheral joint inflammation.  Recheck sed rate and CRP if persistent or worsening would be more suggestive.  Discussed methotrexate as a first-line treatment option for suspected disease would also provide some insight based on treatment response.   Irritable bowel syndrome with diarrhea  Has planned colonoscopy to evaluate GI issues previously ascribed to IBS diarrhea predominant.  Recommend waiting till after the study in November before starting any systemic immunomodulatory treatment.  High risk medication use - Plan: Hepatitis B core antibody, IgM, Hepatitis B surface antigen, Hepatitis C antibody  Checking hepatitis B and C  serology baseline anticipation of starting methotrexate.  Discussed risk of medication including cytopenias, hepatotoxicity, GI intolerance, hypersensitivity reaction, and infections.  Discussed pregnancy risk she currently uses oral contraceptive.  Myofascial pain syndrome  Still has some more widespread myofascial pain.  Also with the persistent fatigue and IBS symptoms consistent for fibromyalgia syndrome.  Has seen a partial improvement with the Cymbalta 30 mg.  Orders: Orders Placed This Encounter  Procedures   Sedimentation rate   C-reactive protein   Hepatitis B core antibody, IgM   Hepatitis B surface antigen   Hepatitis C antibody   No orders of the defined types were placed in this encounter.    Follow-Up Instructions: Return in about 2 months (around 12/07/2023) for ?PsA ?MTX start f/u 2mos.   Fuller Plan, MD  Note - This record has been created using [employer].  Chart creation errors have been sought, but may not always  have been located. Such creation errors do not reflect on  the standard of medical care.

## 2023-10-07 ENCOUNTER — Ambulatory Visit: Payer: Medicaid Other | Attending: Internal Medicine | Admitting: Internal Medicine

## 2023-10-07 ENCOUNTER — Encounter: Payer: Self-pay | Admitting: Internal Medicine

## 2023-10-07 VITALS — BP 138/85 | HR 91 | Resp 14 | Ht 64.0 in | Wt 305.0 lb

## 2023-10-07 DIAGNOSIS — K580 Irritable bowel syndrome with diarrhea: Secondary | ICD-10-CM

## 2023-10-07 DIAGNOSIS — L02818 Cutaneous abscess of other sites: Secondary | ICD-10-CM | POA: Diagnosis not present

## 2023-10-07 DIAGNOSIS — Z79899 Other long term (current) drug therapy: Secondary | ICD-10-CM

## 2023-10-07 DIAGNOSIS — M7918 Myalgia, other site: Secondary | ICD-10-CM | POA: Diagnosis not present

## 2023-10-07 DIAGNOSIS — L4050 Arthropathic psoriasis, unspecified: Secondary | ICD-10-CM | POA: Diagnosis not present

## 2023-10-08 LAB — C-REACTIVE PROTEIN: CRP: 16.7 mg/L — ABNORMAL HIGH (ref <8.0)

## 2023-10-08 LAB — HEPATITIS C ANTIBODY: Hepatitis C Ab: NONREACTIVE

## 2023-10-08 LAB — HEPATITIS B CORE ANTIBODY, IGM: Hep B C IgM: NONREACTIVE

## 2023-10-08 LAB — HEPATITIS B SURFACE ANTIGEN: Hepatitis B Surface Ag: NONREACTIVE

## 2023-10-08 LAB — SEDIMENTATION RATE: Sed Rate: 33 mm/h — ABNORMAL HIGH (ref 0–20)

## 2023-10-15 DIAGNOSIS — F411 Generalized anxiety disorder: Secondary | ICD-10-CM | POA: Diagnosis not present

## 2023-10-15 DIAGNOSIS — F900 Attention-deficit hyperactivity disorder, predominantly inattentive type: Secondary | ICD-10-CM | POA: Diagnosis not present

## 2023-10-15 DIAGNOSIS — F331 Major depressive disorder, recurrent, moderate: Secondary | ICD-10-CM | POA: Diagnosis not present

## 2023-10-23 DIAGNOSIS — F411 Generalized anxiety disorder: Secondary | ICD-10-CM | POA: Diagnosis not present

## 2023-10-23 DIAGNOSIS — F331 Major depressive disorder, recurrent, moderate: Secondary | ICD-10-CM | POA: Diagnosis not present

## 2023-10-27 ENCOUNTER — Encounter: Payer: Self-pay | Admitting: Orthopedic Surgery

## 2023-10-28 ENCOUNTER — Other Ambulatory Visit: Payer: Self-pay | Admitting: Orthopedic Surgery

## 2023-10-28 DIAGNOSIS — L0291 Cutaneous abscess, unspecified: Secondary | ICD-10-CM

## 2023-10-28 MED ORDER — DOXYCYCLINE HYCLATE 100 MG PO TABS
100.0000 mg | ORAL_TABLET | Freq: Two times a day (BID) | ORAL | 0 refills | Status: DC
Start: 2023-10-28 — End: 2023-11-03

## 2023-10-29 ENCOUNTER — Ambulatory Visit: Payer: Medicaid Other | Admitting: Radiology

## 2023-10-29 DIAGNOSIS — F331 Major depressive disorder, recurrent, moderate: Secondary | ICD-10-CM | POA: Diagnosis not present

## 2023-10-29 DIAGNOSIS — F900 Attention-deficit hyperactivity disorder, predominantly inattentive type: Secondary | ICD-10-CM | POA: Diagnosis not present

## 2023-10-29 DIAGNOSIS — F411 Generalized anxiety disorder: Secondary | ICD-10-CM | POA: Diagnosis not present

## 2023-11-03 ENCOUNTER — Other Ambulatory Visit: Payer: Self-pay | Admitting: Orthopedic Surgery

## 2023-11-03 DIAGNOSIS — L0291 Cutaneous abscess, unspecified: Secondary | ICD-10-CM

## 2023-11-03 MED ORDER — CIPROFLOXACIN HCL 500 MG PO TABS: 500.0000 mg | ORAL_TABLET | Freq: Two times a day (BID) | ORAL | 0 refills | Status: AC

## 2023-11-05 ENCOUNTER — Encounter (HOSPITAL_COMMUNITY): Payer: Self-pay | Admitting: Gastroenterology

## 2023-11-05 NOTE — Progress Notes (Signed)
Attempted to obtain medical history for pre op call via telephone, unable to reach at this time. HIPAA compliant voicemail message left requesting return call to pre surgical testing department.

## 2023-11-09 ENCOUNTER — Other Ambulatory Visit: Payer: Self-pay | Admitting: Nurse Practitioner

## 2023-11-09 ENCOUNTER — Encounter: Payer: Self-pay | Admitting: Orthopedic Surgery

## 2023-11-09 DIAGNOSIS — I4891 Unspecified atrial fibrillation: Secondary | ICD-10-CM

## 2023-11-09 DIAGNOSIS — I10 Essential (primary) hypertension: Secondary | ICD-10-CM

## 2023-11-09 DIAGNOSIS — F4321 Adjustment disorder with depressed mood: Secondary | ICD-10-CM

## 2023-11-09 DIAGNOSIS — D529 Folate deficiency anemia, unspecified: Secondary | ICD-10-CM

## 2023-11-09 DIAGNOSIS — E1165 Type 2 diabetes mellitus with hyperglycemia: Secondary | ICD-10-CM

## 2023-11-09 NOTE — Telephone Encounter (Signed)
Hi Phyllis Gentry. This patient now has you listed as her PCP. Would you be able to fill this for her? Please and thank you.  Vincent Gros, FNP-c

## 2023-11-10 MED ORDER — METFORMIN HCL ER 500 MG PO TB24: 500.0000 mg | ORAL_TABLET | Freq: Every day | ORAL | 1 refills | Status: DC

## 2023-11-10 MED ORDER — FOLIC ACID 1 MG PO TABS: 1.0000 mg | ORAL_TABLET | Freq: Every day | ORAL | 1 refills | Status: DC

## 2023-11-10 MED ORDER — PROPRANOLOL HCL 40 MG PO TABS
40.0000 mg | ORAL_TABLET | Freq: Two times a day (BID) | ORAL | 1 refills | Status: DC
Start: 2023-11-10 — End: 2024-02-10

## 2023-11-10 MED ORDER — DULOXETINE HCL 30 MG PO CPEP: 30.0000 mg | ORAL_CAPSULE | Freq: Every day | ORAL | 1 refills | Status: DC

## 2023-11-11 ENCOUNTER — Telehealth: Payer: Self-pay | Admitting: Gastroenterology

## 2023-11-12 NOTE — Anesthesia Preprocedure Evaluation (Signed)
Anesthesia Evaluation  Patient identified by MRN, date of birth, ID band Patient awake    Reviewed: Allergy & Precautions, NPO status , Patient's Chart, lab work & pertinent test results, reviewed documented beta blocker date and time   Airway Mallampati: III  TM Distance: >3 FB Neck ROM: Full    Dental no notable dental hx. (+) Dental Advisory Given, Teeth Intact   Pulmonary asthma    Pulmonary exam normal breath sounds clear to auscultation       Cardiovascular hypertension, Pt. on home beta blockers Normal cardiovascular exam Rhythm:Regular Rate:Normal     Neuro/Psych  PSYCHIATRIC DISORDERS Anxiety Depression    negative neurological ROS     GI/Hepatic Neg liver ROS,GERD  ,,  Endo/Other    Class 4 obesity  Renal/GU negative Renal ROS     Musculoskeletal  (+) Arthritis ,  Fibromyalgia -  Abdominal  (+) + obese  Peds  Hematology  (+) Blood dyscrasia, anemia   Anesthesia Other Findings   Reproductive/Obstetrics negative OB ROS                             Anesthesia Physical Anesthesia Plan  ASA: 3  Anesthesia Plan: MAC   Post-op Pain Management: Minimal or no pain anticipated   Induction: Intravenous  PONV Risk Score and Plan: 2 and Propofol infusion, TIVA and Treatment may vary due to age or medical condition  Airway Management Planned: Natural Airway  Additional Equipment:   Intra-op Plan:   Post-operative Plan:   Informed Consent: I have reviewed the patients History and Physical, chart, labs and discussed the procedure including the risks, benefits and alternatives for the proposed anesthesia with the patient or authorized representative who has indicated his/her understanding and acceptance.     Dental advisory given  Plan Discussed with: CRNA  Anesthesia Plan Comments:        Anesthesia Quick Evaluation

## 2023-11-13 ENCOUNTER — Ambulatory Visit (HOSPITAL_COMMUNITY): Payer: Medicaid Other | Admitting: Anesthesiology

## 2023-11-13 ENCOUNTER — Other Ambulatory Visit: Payer: Self-pay

## 2023-11-13 ENCOUNTER — Encounter (HOSPITAL_COMMUNITY)
Admission: RE | Disposition: A | Discharge status: Home / Self Care | Payer: Self-pay | Source: Home / Self Care | Attending: Gastroenterology

## 2023-11-13 ENCOUNTER — Ambulatory Visit (HOSPITAL_COMMUNITY)
Admission: RE | Admit: 2023-11-13 | Discharge: 2023-11-13 | Disposition: A | Discharge status: Home / Self Care | Payer: Medicaid Other | Attending: Gastroenterology | Admitting: Gastroenterology

## 2023-11-13 ENCOUNTER — Encounter (HOSPITAL_COMMUNITY): Payer: Self-pay | Admitting: Gastroenterology

## 2023-11-13 ENCOUNTER — Ambulatory Visit (HOSPITAL_COMMUNITY): Payer: Self-pay | Admitting: Anesthesiology

## 2023-11-13 DIAGNOSIS — K644 Residual hemorrhoidal skin tags: Secondary | ICD-10-CM

## 2023-11-13 DIAGNOSIS — Z6841 Body Mass Index (BMI) 40.0 and over, adult: Secondary | ICD-10-CM | POA: Insufficient documentation

## 2023-11-13 DIAGNOSIS — K219 Gastro-esophageal reflux disease without esophagitis: Secondary | ICD-10-CM

## 2023-11-13 DIAGNOSIS — E6689 Other obesity not elsewhere classified: Secondary | ICD-10-CM | POA: Insufficient documentation

## 2023-11-13 DIAGNOSIS — J45909 Unspecified asthma, uncomplicated: Secondary | ICD-10-CM | POA: Diagnosis not present

## 2023-11-13 DIAGNOSIS — K625 Hemorrhage of anus and rectum: Secondary | ICD-10-CM

## 2023-11-13 DIAGNOSIS — R1084 Generalized abdominal pain: Secondary | ICD-10-CM

## 2023-11-13 DIAGNOSIS — F419 Anxiety disorder, unspecified: Secondary | ICD-10-CM | POA: Diagnosis not present

## 2023-11-13 DIAGNOSIS — K2970 Gastritis, unspecified, without bleeding: Secondary | ICD-10-CM

## 2023-11-13 DIAGNOSIS — K921 Melena: Secondary | ICD-10-CM | POA: Insufficient documentation

## 2023-11-13 DIAGNOSIS — K648 Other hemorrhoids: Secondary | ICD-10-CM | POA: Diagnosis not present

## 2023-11-13 DIAGNOSIS — K2100 Gastro-esophageal reflux disease with esophagitis, without bleeding: Secondary | ICD-10-CM | POA: Diagnosis not present

## 2023-11-13 DIAGNOSIS — F32A Depression, unspecified: Secondary | ICD-10-CM | POA: Insufficient documentation

## 2023-11-13 DIAGNOSIS — I10 Essential (primary) hypertension: Secondary | ICD-10-CM | POA: Diagnosis not present

## 2023-11-13 DIAGNOSIS — M797 Fibromyalgia: Secondary | ICD-10-CM | POA: Diagnosis not present

## 2023-11-13 DIAGNOSIS — K529 Noninfective gastroenteritis and colitis, unspecified: Secondary | ICD-10-CM | POA: Diagnosis not present

## 2023-11-13 HISTORY — PX: BIOPSY: SHX5522

## 2023-11-13 HISTORY — PX: ESOPHAGOGASTRODUODENOSCOPY (EGD) WITH PROPOFOL: SHX5813

## 2023-11-13 HISTORY — PX: COLONOSCOPY WITH PROPOFOL: SHX5780

## 2023-11-13 LAB — PREGNANCY, URINE: Preg Test, Ur: NEGATIVE

## 2023-11-13 LAB — GLUCOSE, CAPILLARY: Glucose-Capillary: 95 mg/dL (ref 70–99)

## 2023-11-13 SURGERY — ESOPHAGOGASTRODUODENOSCOPY (EGD) WITH PROPOFOL
Anesthesia: Monitor Anesthesia Care

## 2023-11-13 MED ORDER — PANTOPRAZOLE SODIUM 40 MG PO TBEC: 40.0000 mg | DELAYED_RELEASE_TABLET | Freq: Every day | ORAL | 3 refills | Status: DC

## 2023-11-13 MED ORDER — PROPOFOL 1000 MG/100ML IV EMUL
INTRAVENOUS | Status: AC
  Filled 2023-11-13: qty 100

## 2023-11-13 MED ORDER — ONDANSETRON HCL 4 MG/2ML IJ SOLN
INTRAMUSCULAR | Status: DC | PRN
  Administered 2023-11-13: 4 mg via INTRAVENOUS

## 2023-11-13 MED ORDER — SODIUM CHLORIDE 0.9 % IV SOLN: INTRAVENOUS | Status: DC

## 2023-11-13 MED ORDER — PROPOFOL 500 MG/50ML IV EMUL
INTRAVENOUS | Status: DC | PRN
  Administered 2023-11-13: 130 ug/kg/min via INTRAVENOUS

## 2023-11-13 MED ORDER — COLESTIPOL HCL 1 G PO TABS: 1.0000 g | ORAL_TABLET | Freq: Three times a day (TID) | ORAL | 11 refills | Status: DC

## 2023-11-13 MED ORDER — PROPOFOL 10 MG/ML IV BOLUS
INTRAVENOUS | Status: DC | PRN
  Administered 2023-11-13 (×2): 20 mg via INTRAVENOUS

## 2023-11-13 MED ORDER — LIDOCAINE HCL 1 % IJ SOLN
INTRAMUSCULAR | Status: DC | PRN
  Administered 2023-11-13 (×2): 50 mg via INTRADERMAL

## 2023-11-13 SURGICAL SUPPLY — 23 items
BLOCK BITE 60FR ADLT L/F BLUE (MISCELLANEOUS) ×2 IMPLANT
ELECT REM PT RETURN 9FT ADLT (ELECTROSURGICAL)
ELECTRODE REM PT RTRN 9FT ADLT (ELECTROSURGICAL) IMPLANT
FCP BXJMBJMB 240X2.8X (CUTTING FORCEPS)
FLOOR PAD 36X40 (MISCELLANEOUS) ×2
FORCEP RJ3 GP 1.8X160 W-NEEDLE (CUTTING FORCEPS) IMPLANT
FORCEPS BIOP RAD 4 LRG CAP 4 (CUTTING FORCEPS) IMPLANT
FORCEPS BXJMBJMB 240X2.8X (CUTTING FORCEPS) IMPLANT
INJECTOR/SNARE I SNARE (MISCELLANEOUS) IMPLANT
LUBRICANT JELLY 4.5OZ STERILE (MISCELLANEOUS) IMPLANT
MANIFOLD NEPTUNE II (INSTRUMENTS) IMPLANT
NDL SCLEROTHERAPY 25GX240 (NEEDLE) IMPLANT
NEEDLE SCLEROTHERAPY 25GX240 (NEEDLE)
PAD FLOOR 36X40 (MISCELLANEOUS) ×2 IMPLANT
PROBE APC STR FIRE (PROBE) IMPLANT
PROBE INJECTION GOLD 7FR (MISCELLANEOUS) IMPLANT
SNARE ROTATE MED OVAL 20MM (MISCELLANEOUS) IMPLANT
SNARE SHORT THROW 13M SML OVAL (MISCELLANEOUS) IMPLANT
SYR 50ML LL SCALE MARK (SYRINGE) IMPLANT
TRAP SPECIMEN MUCOUS 40CC (MISCELLANEOUS) IMPLANT
TUBING ENDO SMARTCAP PENTAX (MISCELLANEOUS) ×4 IMPLANT
TUBING IRRIGATION ENDOGATOR (MISCELLANEOUS) ×2 IMPLANT
WATER STERILE IRR 1000ML POUR (IV SOLUTION) IMPLANT

## 2023-11-13 NOTE — Discharge Instructions (Signed)

## 2023-11-13 NOTE — Anesthesia Postprocedure Evaluation (Signed)
Anesthesia Post Note  Patient: Phyllis Gentry  Procedure(s) Performed: ESOPHAGOGASTRODUODENOSCOPY (EGD) WITH PROPOFOL COLONOSCOPY WITH PROPOFOL BIOPSY     Patient location during evaluation: PACU Anesthesia Type: MAC Level of consciousness: awake and alert Pain management: pain level controlled Vital Signs Assessment: post-procedure vital signs reviewed and stable Respiratory status: spontaneous breathing Cardiovascular status: stable Anesthetic complications: no   No notable events documented.  Last Vitals:  Vitals:   11/13/23 0930 11/13/23 0940  BP: 135/79 135/72  Pulse: 70 66  Resp: 17 12  Temp:    SpO2: 100% 100%    Last Pain:  Vitals:   11/13/23 0940  TempSrc:   PainSc: 0-No pain                 Lewie Loron

## 2023-11-13 NOTE — Op Note (Signed)
[facility] Patient Name: Phyllis Gentry Procedure Date: 11/13/2023 MRN: [id number] Attending MD: Napoleon Form , MD, [id number] Date of Birth: 19-Jul-1986 CSN: [id number] Age: 37 Admit Type: Outpatient Procedure:                Upper GI endoscopy Indications:              Generalized abdominal pain, Esophageal reflux                            symptoms that persist despite appropriate therapy Providers:                Napoleon Form, MD, Suzy Bouchard, RN, Geoffery Lyons, Technician Referring MD:              Medicines:                Monitored Anesthesia Care Complications:            No immediate complications. Estimated Blood Loss:     Estimated blood loss was minimal. Procedure:                Pre-Anesthesia Assessment:                           - Prior to the procedure, a History and Physical                            was performed, and patient medications and                            allergies were reviewed. The patient's tolerance of                            previous anesthesia was also reviewed. The risks                            and benefits of the procedure and the sedation                            options and risks were discussed with the patient.                            All questions were answered, and informed consent                            was obtained. Prior Anticoagulants: The patient has                            taken no anticoagulant or antiplatelet agents. ASA                            Grade Assessment: III - A patient with severe  systemic disease. After reviewing the risks and                            benefits, the patient was deemed in satisfactory                            condition to undergo the procedure.                           After obtaining informed consent, the endoscope was                            passed under direct vision. Throughout the                             procedure, the patient's blood pressure, pulse, and                            oxygen saturations were monitored continuously. The                            [id number]-[id number] ([id number] Olympus endoscope was introduced                            through the mouth, and advanced to the second part                            of duodenum. The upper GI endoscopy was                            accomplished without difficulty. The patient                            tolerated the procedure well. Scope In: Scope Out: Findings:      The Z-line was regular and was found 38 cm from the incisors.      The examined esophagus was normal.      The gastroesophageal flap valve was visualized endoscopically and       classified as Hill Grade II (fold present, opens with respiration).      Patchy mild inflammation characterized by congestion (edema), erosions       and erythema was found in the entire examined stomach. Biopsies were       taken with a cold forceps for Helicobacter pylori testing.      The cardia and gastric fundus were normal on retroflexion.      The examined duodenum was normal. Impression:               - Z-line regular, 38 cm from the incisors.                           - Normal esophagus.                           - Gastroesophageal flap valve classified as Hill  Grade II (fold present, opens with respiration).                           - Gastritis. Biopsied.                           - Normal examined duodenum. Moderate Sedation:      N/A Recommendation:           - Patient has a contact number available for                            emergencies. The signs and symptoms of potential                            delayed complications were discussed with the                            patient. Return to normal activities tomorrow.                            Written discharge instructions were provided to the                            patient.                            - Resume previous diet.                           - Continue present medications.                           - Await pathology results.                           - Pantoprazole 40mg  daily                           - Avoid NSAID's Procedure Code(s):        --- Professional ---                           [phone], Esophagogastroduodenoscopy, flexible,                            transoral; with biopsy, single or multiple Diagnosis Code(s):        --- Professional ---                           K29.70, Gastritis, unspecified, without bleeding                           R10.84, Generalized abdominal pain                           K21.9, Gastro-esophageal reflux disease without  esophagitis CPT copyright 2022 American Medical Association. All rights reserved. The codes documented in this report are preliminary and upon coder review may  be revised to meet current compliance requirements. Napoleon Form, MD 11/13/2023 9:22:32 AM This report has been signed electronically. Number of Addenda: 0

## 2023-11-13 NOTE — Transfer of Care (Signed)
Immediate Anesthesia Transfer of Care Note  Patient: Phyllis Gentry  Procedure(s) Performed: ESOPHAGOGASTRODUODENOSCOPY (EGD) WITH PROPOFOL COLONOSCOPY WITH PROPOFOL BIOPSY  Patient Location: Endoscopy Unit  Anesthesia Type:MAC  Level of Consciousness: awake and patient cooperative  Airway & Oxygen Therapy: Patient Spontanous Breathing and Patient connected to face mask  Post-op Assessment: Report given to RN and Post -op Vital signs reviewed and stable  Post vital signs: Reviewed and stable  Last Vitals:  Vitals Value Taken Time  BP    Temp    Pulse    Resp    SpO2      Last Pain:  Vitals:   11/13/23 0729  TempSrc: Temporal  PainSc: 0-No pain         Complications: No notable events documented.

## 2023-11-13 NOTE — H&P (Signed)
[facility] History and Physical   Primary Care Physician:  Octavia Heir, NP   Reason for Procedure:  GERD, generalized abd pain, rectal bleeding and chronic diarrhea  Plan:    EGD and colonoscopy with possible interventions as needed     HPI: Phyllis Gentry is a very pleasant 37 y.o. female here for EGD and colonoscopy for evaluation of GERD, generalized abd pain, rectal bleeding and chronic diarrhea . Please refer to office visit note by Alcide Evener for additional details  The risks and benefits as well as alternatives of endoscopic procedure(s) have been discussed and reviewed. All questions answered. The patient agrees to proceed.    Past Medical History:  Diagnosis Date   A-fib (HCC)    Anemia    Anxiety    Arthritis    Asthma    Depression    Fibromyalgia    GERD (gastroesophageal reflux disease)    High cholesterol    Hypertension    IBS (irritable bowel syndrome)    Lymphedema    Obesity    PCOS (polycystic ovarian syndrome)    Prediabetes     Past Surgical History:  Procedure Laterality Date   CHOLECYSTECTOMY     DILATION AND CURETTAGE OF UTERUS     GALLBLADDER SURGERY  2018    Prior to Admission medications   Medication Sig Start Date End Date Taking? Authorizing Provider  albuterol (VENTOLIN HFA) 108 (90 Base) MCG/ACT inhaler INHALE 2 PUFFS BY MOUTH EVERY 4 HOURS AS NEEDED FOR WHEEZING OR SHORTNESS OF BREATH 07/15/23  Yes Saralyn Pilar A, PA  Bromelain 250 MG CAPS Take 1 tablet by mouth daily.   Yes [provider]  clindamycin (CLEOCIN T) 1 % lotion APPLY TO AFFECTED AREAS DAILY DIRECTLY AFTER SHOWER   Yes [provider]  Coenzyme Q10 (COQ10 PO) Take 200 mg by mouth daily.   Yes [provider]  Cyanocobalamin 1000 MCG SUBL PLACE 1 TABLET(S) EVERY DAY BY SUBLINGUAL ROUTE. 11/29/19  Yes [provider]  dicyclomine (BENTYL) 20 MG tablet Take 1 tablet (20 mg total) by mouth every 6 (six)  hours as needed for spasms. 06/16/23  Yes Boscia, Kathlynn Grate, NP  DULoxetine (CYMBALTA) 30 MG capsule Take 1 capsule (30 mg total) by mouth daily. 11/10/23  Yes Fargo, Amy E, NP  fexofenadine (ALLEGRA) 180 MG tablet    Yes [provider]  folic acid (FOLVITE) 1 MG tablet TAKE 1 TABLET(1 MG) BY MOUTH DAILY 11/10/23  Yes Fargo, Amy E, NP  metFORMIN (GLUCOPHAGE-XR) 500 MG 24 hr tablet Take 1 tablet (500 mg total) by mouth daily with breakfast. 11/10/23  Yes Fargo, Amy E, NP  norethindrone (ORTHO MICRONOR) 0.35 MG tablet Take 1 tablet (0.35 mg total) by mouth daily. 09/24/23  Yes Chrzanowski, Jami B, NP  omeprazole (PRILOSEC) 20 MG capsule Take 20 mg by mouth at bedtime.   Yes [provider]  propranolol (INDERAL) 40 MG tablet Take 1 tablet (40 mg total) by mouth 2 (two) times daily. 11/10/23  Yes Fargo, Amy E, NP  rosuvastatin (CRESTOR) 20 MG tablet TAKE 1 TABLET(20 MG) BY MOUTH DAILY 09/19/23  Yes Jodelle Red, MD  albuterol (PROVENTIL) (2.5 MG/3ML) 0.083% nebulizer solution Take 3 mLs (2.5 mg total) by nebulization every 6 (six) hours as needed for wheezing or shortness of breath. 03/04/23   Carlean Jews, NP  folic acid (FOLVITE) 1 MG tablet Take 1 tablet (1 mg total) by mouth daily. 11/10/23   Coletta Memos,  Amy E, NP  medroxyPROGESTERone (PROVERA) 10 MG tablet Take 10 mg by mouth as needed. Patient not taking: Reported on 10/07/2023    [provider]  OVER THE COUNTER MEDICATION Take 1,320 mg by mouth every 30 (thirty) days. Caprylic Acid    [provider]    Current Facility-Administered Medications  Medication Dose Route Frequency Provider Last Rate Last Admin   0.9 %  sodium chloride infusion   Intravenous Continuous Arnaldo Natal, NP        Allergies as of 08/21/2023 - Review Complete 08/21/2023  Allergen Reaction Noted   Aminophylline Anaphylaxis 01/25/2022   Cefaclor Anaphylaxis 01/25/2022   Cephalosporins Anaphylaxis 01/25/2022    Diphenhydramine Anaphylaxis 04/02/2017   Sulfa antibiotics Anaphylaxis 01/25/2022   Clarithromycin  01/25/2022   Iodinated contrast media  01/25/2022   Iodine  06/29/2020    Family History  Problem Relation Age of Onset   Dementia Mother    Breast cancer Mother        onset 11, 2nd 2016   Diabetes Mother    Alzheimer's disease Mother    Heart disease Father    Diabetes Father    Irritable bowel syndrome Father    Breast cancer Maternal Aunt        onset 12   Colon cancer Maternal Great-grandfather    Breast cancer Other        maternal first cousin, onset 32   Esophageal cancer Neg Hx    Stomach cancer Neg Hx     Social History   Socioeconomic History   Marital status: Married    Spouse name: Not on file   Number of children: 0   Years of education: Not on file   Highest education level: Bachelor's degree (e.g., BA, AB, BS)  Occupational History   Occupation: Caregiver  Tobacco Use   Smoking status: Never    Passive exposure: Past   Smokeless tobacco: Never  Vaping Use   Vaping status: Never Used  Substance and Sexual Activity   Alcohol use: Not Currently   Drug use: Not Currently   Sexual activity: Yes    Partners: Male    Comment: partner is transgener to female  Other Topics Concern   Not on file  Social History Narrative   Tobacco use, amount per day now: No   Past tobacco use, amount per day: No   How many years did you use tobacco: No   Alcohol use (drinks per week): No   Diet:   Do you drink/eat things with caffeine: Yes   Marital status:   Married                               What year were you married? 2022   Do you live in a house, apartment, assisted living, condo, trailer, etc.? Apartment   Is it one or more stories? 3   How many persons live in your home? 2   Do you have pets in your home?( please list) 2 Cats, 1 Dog   Highest Level of education completed? Bachelors Degree   Current or past profession: Art    Do you exercise?       No                            Type and how often?   Do you have a living will? No  Do you have a DNR form?    No                               If not, do you want to discuss one?   Do you have signed POA/HPOA forms No                        If so, please bring to you appointment      Do you have any difficulty bathing or dressing yourself? No   Do you have any difficulty preparing food or eating? No   Do you have any difficulty managing your medications? No   Do you have any difficulty managing your finances? No   Do you have any difficulty affording your medications? No   Social Determinants of Health   Financial Resource Strain: Patient Declined (09/14/2023)   Overall Financial Resource Strain (CARDIA)    Difficulty of Paying Living Expenses: Patient declined  Food Insecurity: Food Insecurity Present (09/14/2023)   Hunger Vital Sign    Worried About Running Out of Food in the Last Year: Often true    Ran Out of Food in the Last Year: Patient declined  Transportation Needs: No Transportation Needs (09/14/2023)   PRAPARE - Administrator, Civil Service (Medical): No    Lack of Transportation (Non-Medical): No  Physical Activity: Insufficiently Active (09/14/2023)   Exercise Vital Sign    Days of Exercise per Week: 1 day    Minutes of Exercise per Session: 30 min  Stress: Stress Concern Present (09/14/2023)   [employer] of Occupational Health - Occupational Stress Questionnaire    Feeling of Stress : Very much  Social Connections: Moderately Isolated (09/14/2023)   Social Connection and Isolation Panel [NHANES]    Frequency of Communication with Friends and Family: More than three times a week    Frequency of Social Gatherings with Friends and Family: Never    Attends Religious Services: Never    Database administrator or Organizations: No    Attends Engineer, structural: Not on file    Marital Status: Married  Catering manager Violence: Not on file    Review of  Systems:  All other review of systems negative except as mentioned in the HPI.  Physical Exam: Vital signs in last 24 hours: BP (!) 157/89   Pulse 79   Temp 98.1 F (36.7 C) (Temporal)   Resp 12   Ht 5\' 4"  (1.626 m)   Wt (!) 138.3 kg   SpO2 100%   BMI 52.35 kg/m  General:   Alert, NAD Lungs:  Clear .   Heart:  Regular rate and rhythm Abdomen:  Soft, nontender and nondistended. Neuro/Psych:  Alert and cooperative. Normal mood and affect. A and O x 3  Reviewed labs, radiology imaging, old records and pertinent past GI work up    K. Scherry Ran , MD [phone]

## 2023-11-13 NOTE — Op Note (Signed)
[facility] Patient Name: Phyllis Gentry Procedure Date: 11/13/2023 MRN: [id number] Attending MD: Napoleon Form , MD, [id number] Date of Birth: 1986-05-19 CSN: [id number] Age: 37 Admit Type: Outpatient Procedure:                Colonoscopy Indications:              Evaluation of unexplained GI bleeding presenting                            with Hematochezia, Clinically significant diarrhea                            of unexplained origin Providers:                Napoleon Form, MD, Suzy Bouchard, RN, Geoffery Lyons, Technician Referring MD:              Medicines:                Monitored Anesthesia Care Complications:            No immediate complications. Estimated Blood Loss:     Estimated blood loss was minimal. Procedure:                Pre-Anesthesia Assessment:                           - Prior to the procedure, a History and Physical                            was performed, and patient medications and                            allergies were reviewed. The patient's tolerance of                            previous anesthesia was also reviewed. The risks                            and benefits of the procedure and the sedation                            options and risks were discussed with the patient.                            All questions were answered, and informed consent                            was obtained. Prior Anticoagulants: The patient has                            taken no anticoagulant or antiplatelet agents. ASA                            Grade Assessment:  III - A patient with severe                            systemic disease. After reviewing the risks and                            benefits, the patient was deemed in satisfactory                            condition to undergo the procedure.                           After obtaining informed consent, the colonoscope                            was  passed under direct vision. Throughout the                            procedure, the patient's blood pressure, pulse, and                            oxygen saturations were monitored continuously. The                            [id number]-HQ190L ([id number]) Olympus colonoscope was                            introduced through the anus and advanced to the the                            cecum, identified by appendiceal orifice and                            ileocecal valve. The colonoscopy was performed                            without difficulty. The patient tolerated the                            procedure well. The quality of the bowel                            preparation was good. The ileocecal valve,                            appendiceal orifice, and rectum were photographed. Scope In: 8:43:52 AM Scope Out: 8:59:57 AM Scope Withdrawal Time: 0 hours 10 minutes 31 seconds  Total Procedure Duration: 0 hours 16 minutes 5 seconds  Findings:      The perianal and digital rectal examinations were normal.      Normal mucosa was found in the entire colon. Biopsies for histology were       taken with a cold forceps from the right colon and left colon for       evaluation of microscopic colitis.  The terminal ileum appeared normal.      Non-bleeding external and internal hemorrhoids were found during       retroflexion. The hemorrhoids were small. Impression:               - Normal mucosa in the entire examined colon.                            Biopsied.                           - The examined portion of the ileum was normal.                           - Non-bleeding external and internal hemorrhoids. Moderate Sedation:      N/A Recommendation:           - Patient has a contact number available for                            emergencies. The signs and symptoms of potential                            delayed complications were discussed with the                            patient. Return to  normal activities tomorrow.                            Written discharge instructions were provided to the                            patient.                           - Resume previous diet.                           - Continue present medications.                           - Await pathology results.                           - Colestid 1gm before meals and at bedtime for bile                            salt induced diarrhea Procedure Code(s):        --- Professional ---                           [phone], Colonoscopy, flexible; with biopsy, single                            or multiple Diagnosis Code(s):        --- Professional ---  K64.8, Other hemorrhoids                           K92.1, Melena (includes Hematochezia)                           R19.7, Diarrhea, unspecified CPT copyright 2022 American Medical Association. All rights reserved. The codes documented in this report are preliminary and upon coder review may  be revised to meet current compliance requirements. Napoleon Form, MD 11/13/2023 9:20:13 AM This report has been signed electronically. Number of Addenda: 0

## 2023-11-14 ENCOUNTER — Other Ambulatory Visit: Payer: Medicaid Other

## 2023-11-14 LAB — SURGICAL PATHOLOGY

## 2023-11-16 ENCOUNTER — Encounter (HOSPITAL_COMMUNITY): Payer: Self-pay | Admitting: Gastroenterology

## 2023-11-17 NOTE — Telephone Encounter (Signed)
error 

## 2023-11-26 ENCOUNTER — Encounter: Payer: Self-pay | Admitting: Gastroenterology

## 2023-11-27 ENCOUNTER — Ambulatory Visit: Payer: Medicaid Other | Admitting: Orthopedic Surgery

## 2023-12-03 DIAGNOSIS — J209 Acute bronchitis, unspecified: Secondary | ICD-10-CM | POA: Diagnosis not present

## 2023-12-03 DIAGNOSIS — J029 Acute pharyngitis, unspecified: Secondary | ICD-10-CM | POA: Diagnosis not present

## 2023-12-03 DIAGNOSIS — R059 Cough, unspecified: Secondary | ICD-10-CM | POA: Diagnosis not present

## 2023-12-03 DIAGNOSIS — Z20822 Contact with and (suspected) exposure to covid-19: Secondary | ICD-10-CM | POA: Diagnosis not present

## 2023-12-06 ENCOUNTER — Other Ambulatory Visit: Payer: Self-pay | Admitting: Radiology

## 2023-12-06 DIAGNOSIS — R102 Pelvic and perineal pain: Secondary | ICD-10-CM

## 2023-12-06 DIAGNOSIS — N914 Secondary oligomenorrhea: Secondary | ICD-10-CM

## 2023-12-08 ENCOUNTER — Ambulatory Visit: Payer: Medicaid Other | Admitting: Internal Medicine

## 2023-12-08 NOTE — Telephone Encounter (Signed)
Medication refill request: micronor Last AEX:  03-01-22 Next AEX: 12-30-23 Last MMG (if hormonal medication request): 06-05-23 birads 1:neg Refill authorized: approve or deny as appropriate

## 2023-12-09 ENCOUNTER — Other Ambulatory Visit: Payer: Self-pay

## 2023-12-09 DIAGNOSIS — N914 Secondary oligomenorrhea: Secondary | ICD-10-CM

## 2023-12-09 DIAGNOSIS — R102 Pelvic and perineal pain: Secondary | ICD-10-CM

## 2023-12-09 MED ORDER — NORETHINDRONE 0.35 MG PO TABS: 1.0000 | ORAL_TABLET | Freq: Every day | ORAL | 0 refills | Status: DC

## 2023-12-09 NOTE — Telephone Encounter (Signed)
Med refill request: norethindrone 0.35 mg.   Last AEX: 03/01/22 Next AEX: 12/30/23 Last MMG (if hormonal med) n/a Refill sent earlier today.  Patient notified.  Sent to provider for review.

## 2023-12-29 ENCOUNTER — Ambulatory Visit: Payer: Medicaid Other | Admitting: Radiology

## 2023-12-30 ENCOUNTER — Other Ambulatory Visit (HOSPITAL_COMMUNITY)
Admission: RE | Admit: 2023-12-30 | Discharge: 2023-12-30 | Disposition: A | Discharge status: Home / Self Care | Payer: Medicaid Other | Source: Ambulatory Visit | Attending: Radiology | Admitting: Radiology

## 2023-12-30 ENCOUNTER — Ambulatory Visit (INDEPENDENT_AMBULATORY_CARE_PROVIDER_SITE_OTHER): Payer: Medicaid Other | Admitting: Radiology

## 2023-12-30 ENCOUNTER — Encounter: Payer: Self-pay | Admitting: Radiology

## 2023-12-30 VITALS — BP 132/86 | HR 87 | Ht 64.0 in | Wt 305.0 lb

## 2023-12-30 DIAGNOSIS — Z01419 Encounter for gynecological examination (general) (routine) without abnormal findings: Secondary | ICD-10-CM | POA: Diagnosis not present

## 2023-12-30 DIAGNOSIS — Z1331 Encounter for screening for depression: Secondary | ICD-10-CM

## 2023-12-30 DIAGNOSIS — N914 Secondary oligomenorrhea: Secondary | ICD-10-CM

## 2023-12-30 DIAGNOSIS — R87810 Cervical high risk human papillomavirus (HPV) DNA test positive: Secondary | ICD-10-CM

## 2023-12-30 DIAGNOSIS — R102 Pelvic and perineal pain: Secondary | ICD-10-CM

## 2023-12-30 MED ORDER — NORETHINDRONE 0.35 MG PO TABS: 1.0000 | ORAL_TABLET | Freq: Every day | ORAL | 4 refills | Status: DC

## 2023-12-30 NOTE — Progress Notes (Signed)
 Phyllis Gentry [facility] [birth year] [id number]   History:  38 y.o. G0 presents for annual exam. Hx of PCOS, on POPs, LMP 10/24. Worsening depression since mother passed this summer. Sees a therapist and is on meds. No new gyn concerns. Started a new job she enjoys 3 months ago.   Gynecologic History Patient's last menstrual period was 10/09/2023 (exact date).   Contraception/Family planning: partner is a transgender female Sexually active: yes Last Pap: 2023. Results were: abnormal, Normal pap HPV + neg 16/18/45   Obstetric History OB History  Gravida Para Term Preterm AB Living  0 0 0 0 0 0  SAB IAB Ectopic Multiple Live Births  0 0 0 0 0    The following portions of the patient's history were reviewed and updated as appropriate: allergies, current medications, past family history, past medical history, past social history, past surgical history, and problem list.  ROS  Past medical history, past surgical history, family history and social history were all reviewed and documented in the EPIC chart.  Exam:  Vitals:   12/30/23 1533  BP: 132/86  Pulse: 87  SpO2: 99%  Weight: (!) 305 lb (138.3 kg)  Height: 5' 4 (1.626 m)   Body mass index is 52.35 kg/m.  Physical Exam Vitals and nursing note reviewed. Exam conducted with a chaperone present.  Constitutional:      Appearance: Normal appearance. She is obese.  HENT:     Head: Normocephalic and atraumatic.  Neck:     Thyroid : No thyroid  mass, thyromegaly or thyroid  tenderness.  Cardiovascular:     Rate and Rhythm: Regular rhythm.     Heart sounds: Normal heart sounds.  Pulmonary:     Effort: Pulmonary effort is normal.     Breath sounds: Normal breath sounds.  Chest:  Breasts:    Breasts are symmetrical.     Right: Normal. No inverted nipple, mass, nipple discharge, skin change or tenderness.     Left: Normal. No inverted nipple, mass, nipple discharge, skin change or tenderness.  Abdominal:     General: Abdomen is  flat. Bowel sounds are normal.     Palpations: Abdomen is soft.  Genitourinary:    General: Normal vulva.     Vagina: Normal. No vaginal discharge, bleeding or lesions.     Cervix: Normal. No discharge or lesion.     Uterus: Normal. Not enlarged and not tender.      Adnexa: Right adnexa normal and left adnexa normal.       Right: No mass, tenderness or fullness.         Left: No mass, tenderness or fullness.    Lymphadenopathy:     Upper Body:     Right upper body: No axillary adenopathy.     Left upper body: No axillary adenopathy.  Skin:    General: Skin is warm and dry.  Neurological:     Mental Status: She is alert and oriented to person, place, and time.  Psychiatric:        Mood and Affect: Mood normal.        Thought Content: Thought content normal.        Judgment: Judgment normal.      Darice Hoit, CMA present for exam  Assessment/Plan:   1. Well woman exam with routine gynecological exam (Primary)  2. Secondary oligomenorrhea - norethindrone  (MICRONOR ) 0.35 MG tablet; Take 1 tablet (0.35 mg total) by mouth daily.  Dispense: 84 tablet; Refill: 4  3. Pap smear of  cervix shows high risk HPV present - Cytology - PAP( [facility])  5. Positive depression screening 13 In therapy and managed by psychiatry Feels stable    Discussed SBE, pap screening as directed/appropriate. Recommend of exercise weekly, including weight bearing exercise.   Return in about 1 year (around 12/29/2024) for Annual.  Kavan Devan B WHNP-BC 3:52 PM 12/30/2023

## 2023-12-30 NOTE — Patient Instructions (Signed)

## 2024-01-01 LAB — CYTOLOGY - PAP
Adequacy: ABSENT
Comment: NEGATIVE
Diagnosis: NEGATIVE
High risk HPV: NEGATIVE

## 2024-01-12 ENCOUNTER — Telehealth: Payer: Self-pay | Admitting: Cardiology

## 2024-01-12 NOTE — Telephone Encounter (Signed)
Per patient schedule message:  "I am available after 3pm. I am having pain in my left temple that comes and goes but is tender to the touch. I also need to schedule testing (I reached out after missing phone calls but never heard back)."

## 2024-01-12 NOTE — Progress Notes (Signed)
Office Visit Note  Patient: Phyllis Gentry             Date of Birth: [birth year]           MRN: [id number]             PCP: Octavia Heir, NP Referring: Octavia Heir, NP Visit Date: 01/20/2024   Subjective:  Follow-up (Patient states she would like to talk about the possibility of Elher's Danlos syndrome. )  Discussed the use of AI scribe software for clinical note transcription with the patient, who gave verbal consent to proceed.  History of Present Illness   Phyllis Gentry is a 38 y.o. female here for follow up for joint pain in multiple areas possible psoriatic arthritis and chronic myofascial pain. She presents with recurrent skin abscesses and joint pain.  She has experienced joint pain and hypermobility issues since childhood, with significant pain in the shoulders, neck, and temporal area. She recalls joint issues as a child, including a leg length discrepancy and frequent joint dislocations. Her joints frequently crack and dislocate. She has been diagnosed with temporomandibular joint (TMJ) disorder, experiencing severe pain and stiffness, for which she uses a mouth guard. She also has a history of knee pain, particularly in the right knee. She has been undergoing physical therapy since early last year, focusing on strengthening exercises due to muscle weakness in the affected leg. No recent changes in joint swelling or new areas of pain. She reports significant pain in the temporal area, shoulders, and neck, with occasional sharp pain in the temples. She experiences TMJ pain bilaterally, with pressure and tenderness in the jaw and temple areas. She has difficulty with grip strength and experiences pain when holding a pen. She avoids NSAIDs due to gastritis concerns and primarily uses Tylenol for pain management.  She has a history of recurrent skin abscesses, recent lesions located in the buttock area, with occasional occurrences between the thighs and in the armpits. She is  currently dealing with her third abscess, with two active ones. The first abscess required lancing. These abscesses are larger and more persistent, requiring daily drainage. She has not previously seen dermatology for these issues. There is a family history of similar conditions, as her father experienced similar issues at her age, and she has family members with hidradenitis suppurativa.  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 She experiences flares of irritable bowel syndrome (IBS) and is currently on a proton pump inhibitor, which she feels helps but does not completely alleviate symptoms. She describes days where she feels something is not right but not painful.  She has a history of polycystic ovary syndrome (PCOS) and does not ovulate. She is on Provera to manage her menstrual cycle. She also has lymphedema, primarily affecting her knees, ankles, and feet, for which she wears compression garments.  She reports a long-standing issue with elevated inflammatory markers, such as CRP and ESR, which have fluctuated over the past ten years. She experiences silent migraines, characterized by visual disturbances without pain. She is currently taking Cymbalta 30 mg, which she finds helpful for her symptoms, although she is cautious about increasing the dose due to potential side effects.    Previous HPI 10/07/2023 Phyllis Gentry is a 38 y.o. female here for follow up for joint pain in multiple areas possible psoriatic arthritis and chronic myofascial pain.  Testing at initial visit showed mildly elevated sedimentation rate and  CRP otherwise unremarkable.  Continues having joint pain and stiffness in multiple areas worst affected joints in her left ankle and in bilateral thumbs.  Also with muscle pain in her neck and back which is chronic.  She does see swelling in the left ankle comes and goes intermittently.  Has  severe stiffness in her calves sometimes with cramping sensation.  Cymbalta was increased from 20 to 30 mg daily.  She took a course of antibiotics for a skin boil in her groin area.  Has been about 6 months since she had any skin infection like this sometimes gets them in the axilla and sometimes in the groin.  No other new rashes on her extremities and no nail changes.   Previous HPI 04/03/23 Phyllis Gentry is a 38 y.o. female here for multiple joint pains including left wrist inflammation osteoarthritis, and history of possible psoriatic arthritis and fibromyalgia syndrome.  She has a history of joint pain issues dating back to early childhood with extensive early glucocorticoid exposure due to severe allergy and hypersensitivity problems.  This has contributed to early issues with some carotid artery stenosis, and obesity with PCOS and some early degenerative arthritis changes.  She previously saw rheumatology in 2021 with iniital treatment on NSAIDs. Lab workup at that time showing elevated CRP and platelets with negative ANA testing and no radiographic sacroiliitis.  She has generally had some degree of ongoing joint pains typically affecting her neck and back and knees and ankles somewhat varying severity.  Knee pain worse on the right side and often provoked with climbing stairs or getting up from the ground or low furniture positions.  Also tends to have increase in neck and hand pains towards the end of the day.  Has had bilateral carpal tunnel syndrome but does not require any interventions for this and no complications with excessive weakness dropping items or difficulty performing routine tasks.  Referral to this visit was associated with development of significant left wrist pain and swelling with findings of tenosynovitis and orthopedics clinic.  She did not recall any particular injury activity change or illness preceding onset of symptoms started around late January or beginning of February.   Since the left wrist pain improved she is not having large amounts of visible joint swelling although still has some persistent pain and stiffness.  She takes some Tylenol and ibuprofen as needed but not every day and usually not multiple times per day. Skin disease experiences episodic small patchy rashes usually resolving without any specific long-term treatment.  These were previously suspected as psoriasis but never on a long-term medication.  Currently skin is pretty clear did not see a flareup of rash associated with the left wrist swelling.  With previous rheumatology evaluation her findings with irregular brittle and thin fingernails with frequent splitting were suspected as related to possible psoriatic disease. Fibromyalgia with frequent pain including muscular stiffness and pain throughout the that stays pretty constantly present.  Usually does not feel widespread sensitivity all over or excessive pain with benign stimuli.  Does have issues with silent migraine pretty often.  Has irritable bowel symptoms predominantly diarrhea.  She was started on Cymbalta takes 20 mg daily feels this is partially beneficial for her body aches.   Review of Systems  Constitutional:  Positive for fatigue.  HENT:  Negative for mouth sores and mouth dryness.   Eyes:  Positive for dryness.  Respiratory:  Negative for shortness of breath.   Cardiovascular:  Negative for chest pain  and palpitations.  Gastrointestinal:  Positive for constipation and diarrhea. Negative for blood in stool.  Endocrine: Negative for increased urination.  Genitourinary:  Negative for involuntary urination.  Musculoskeletal:  Positive for joint pain, gait problem, joint pain, joint swelling, myalgias, muscle weakness, morning stiffness, muscle tenderness and myalgias.  Skin:  Positive for rash. Negative for color change, hair loss and sensitivity to sunlight.  Allergic/Immunologic: Positive for susceptible to infections.   Neurological:  Positive for dizziness and headaches.  Hematological:  Negative for swollen glands.  Psychiatric/Behavioral:  Positive for depressed mood. Negative for sleep disturbance. The patient is nervous/anxious.     PMFS History:  Patient Active Problem List   Diagnosis Date Noted   Chronic diarrhea 11/13/2023   Rectal bleeding 11/13/2023   Gastroesophageal reflux disease without esophagitis 11/13/2023   Gastritis and gastroduodenitis 11/13/2023   High risk medication use 10/07/2023   Other fatigue 04/25/2023   Mixed hyperlipidemia 04/25/2023   Prediabetes 04/25/2023   Vitamin D deficiency 04/25/2023   Myofascial pain syndrome 04/03/2023   Patellofemoral pain syndrome of right knee 04/03/2023   Abscess of skin and subcutaneous tissue 10/13/2022   Carotid artery stenosis, asymptomatic, left 09/18/2022   Generalized anxiety disorder 04/18/2022   Menstrual periods irregular 03/03/2022   Anemia due to folic acid deficiency 03/03/2022   Recurrent major depressive disorder, in partial remission (HCC) 03/03/2022   Class 3 severe obesity due to excess calories without serious comorbidity with body mass index (BMI) of 50.0 to 59.9 in adult (HCC) 02/15/2022   Irritable bowel syndrome with diarrhea 01/27/2022   H/O atrial fibrillation without current medication 01/27/2022   Lymphedema 01/27/2022   Polycystic ovary disease 01/27/2022   BMI 50.0-59.9, adult (HCC) 01/27/2022   Fibromyalgia 01/25/2022   Hypertension 01/25/2022   Atrial fibrillation (HCC) 07/07/2021   Anxiety 07/07/2021   Asthma 07/07/2021   Psoriatic arthritis (HCC) 02/10/2021   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, right 10/31/2020   Dizziness 07/10/2020   Pure hypercholesterolemia 07/10/2020   Hereditary lymphedema 07/05/2019   Biliary dyskinesia 04/16/2017   Endometrial polyp 08/27/2016   Menometrorrhagia 08/27/2016   Cervical radiculitis 10/30/2015   Carpal tunnel syndrome 07/03/2015    Past Medical  History:  Diagnosis Date   A-fib (HCC)    Anemia    Anxiety    Arthritis    Asthma    Depression    Fibromyalgia    GERD (gastroesophageal reflux disease)    High cholesterol    Hypertension    IBS (irritable bowel syndrome)    Lymphedema    Obesity    PCOS (polycystic ovarian syndrome)    Prediabetes     Family History  Problem Relation Age of Onset   Dementia Mother    Breast cancer Mother        onset 9, 2nd 2016   Diabetes Mother    Alzheimer's disease Mother    Heart disease Father    Diabetes Father    Irritable bowel syndrome Father    Breast cancer Maternal Aunt        onset 63   Colon cancer Maternal Great-grandfather    Breast cancer Other        maternal first cousin, onset 30   Esophageal cancer Neg Hx    Stomach cancer Neg Hx    Past Surgical History:  Procedure Laterality Date   BIOPSY  11/13/2023   Procedure: BIOPSY;  Surgeon: Napoleon Form, MD;  Location: [facility];  Service: Gastroenterology;;  CHOLECYSTECTOMY     COLONOSCOPY     with endoscopy 2023   COLONOSCOPY WITH PROPOFOL N/A 11/13/2023   Procedure: COLONOSCOPY WITH PROPOFOL;  Surgeon: Napoleon Form, MD;  Location: [facility];  Service: Gastroenterology;  Laterality: N/A;   DILATION AND CURETTAGE OF UTERUS     ESOPHAGOGASTRODUODENOSCOPY (EGD) WITH PROPOFOL N/A 11/13/2023   Procedure: ESOPHAGOGASTRODUODENOSCOPY (EGD) WITH PROPOFOL;  Surgeon: Napoleon Form, MD;  Location: [facility];  Service: Gastroenterology;  Laterality: N/A;   GALLBLADDER SURGERY  2018   Social History   Social History Narrative   Tobacco use, amount per day now: No   Past tobacco use, amount per day: No   How many years did you use tobacco: No   Alcohol use (drinks per week): No   Diet:   Do you drink/eat things with caffeine: Yes   Marital status:   Married                               What year were you married? 2022   Do you live in a house, apartment, assisted living, condo, trailer,  etc.? Apartment   Is it one or more stories? 3   How many persons live in your home? 2   Do you have pets in your home?( please list) 2 Cats, 1 Dog   Highest Level of education completed? Bachelors Degree   Current or past profession: Art    Do you exercise?       No                           Type and how often?   Do you have a living will? No   Do you have a DNR form?    No                               If not, do you want to discuss one?   Do you have signed POA/HPOA forms No                        If so, please bring to you appointment      Do you have any difficulty bathing or dressing yourself? No   Do you have any difficulty preparing food or eating? No   Do you have any difficulty managing your medications? No   Do you have any difficulty managing your finances? No   Do you have any difficulty affording your medications? No    There is no immunization history on file for this patient.   Objective: Vital Signs: BP 137/81 (BP Location: Left Arm, Patient Position: Sitting, Cuff Size: Large)   Pulse 77   Resp 14   Ht 5\' 4"  (1.626 m)   Wt (!) 302 lb (137 kg)   LMP 10/09/2023 (Exact Date) Comment: POP  BMI 51.84 kg/m    Physical Exam Constitutional:      Appearance: She is obese.  Eyes:     Conjunctiva/sclera: Conjunctivae normal.  Cardiovascular:     Rate and Rhythm: Normal rate and regular rhythm.  Pulmonary:     Effort: Pulmonary effort is normal.     Breath sounds: Normal breath sounds.  Musculoskeletal:     Right lower leg: No edema.     Left lower leg: No edema.  Lymphadenopathy:  Cervical: No cervical adenopathy.  Skin:    General: Skin is warm and dry.  Neurological:     Mental Status: She is alert.  Psychiatric:        Mood and Affect: Mood normal.      Musculoskeletal Exam Shoulders full ROM but with difficulty reaching up behind and down low and some guarding in passive movement Elbows full ROM no tenderness or swelling Wrists full ROM no  tenderness or swelling Fingers full ROM no tenderness or swelling Hip normal internal and external rotation without pain Knees full ROM, right knee tenderness to pressure on anterior, worse at quadriceps and patellar tendons Ankles full ROM no tenderness or swelling  Investigation: No additional findings.  Imaging: No results found.  Recent Labs: Lab Results  Component Value Date   WBC 8.9 08/14/2023   HGB 12.3 08/14/2023   PLT 464 (H) 08/14/2023   NA 138 08/14/2023   K 4.7 08/14/2023   CL 102 08/14/2023   CO2 30 08/14/2023   GLUCOSE 93 08/14/2023   BUN 12 08/14/2023   CREATININE 0.72 08/14/2023   BILITOT 0.4 08/14/2023   ALKPHOS 113 03/27/2023   AST 14 08/14/2023   ALT 12 08/14/2023   PROT 7.3 08/14/2023   ALBUMIN 4.3 03/27/2023   CALCIUM 9.4 08/14/2023    Speciality Comments: No specialty comments available.  Procedures:  No procedures performed Allergies: Aminophylline, Cefaclor, Cephalosporins, Diphenhydramine, Sulfa antibiotics, Clarithromycin, Iodinated contrast media, and Iodine   Assessment / Plan:     Visit Diagnoses: Psoriatic arthritis (HCC) - Plan: Sedimentation rate, C-reactive protein History of joint issues and pain, with recent significant pain in the temporal area, shoulders, and neck. Some hypermobility noted on examination, but not definitive for a diagnosis of Ehlers-Danlos syndrome. No definite peripheral synovitis. -Agree with continued Cymbalta 30mg  daily for pain management discussed titration -checking ESR and CRP monitoring previous elevated systemic inflammation  Irritable bowel syndrome with diarrhea Fluctuating symptoms with some days of flare-ups. Currently on a proton pump inhibitor with some relief. -Continue current treatment and monitor symptoms.  Myofascial pain syndrome - Cymbalta 30 mg.  Rashes Recurrent skin abscesses in the groin and buttock area. Concern for possible HS given the distribution and chronicity. Has a family  history of the condition. No dermatology consultation in the past.  Polycystic Ovary Syndrome (PCOS) and Amenorrhea Currently on Provera for endometrial protection due to amenorrhea.  Lymphedema Chronic issue affecting knees, ankles, and feet. -Continue current management strategies.  Temporomandibular Joint (TMJ) Dysfunction Recent significant TMJ pain, currently using a mouth guard. -Continue use of mouth guard and monitor symptoms.     Orders: Orders Placed This Encounter  Procedures   Sedimentation rate   C-reactive protein   No orders of the defined types were placed in this encounter.    Follow-Up Instructions: Return in about 6 months (around 07/19/2024) for ?PsA/FMS f/u 6mos.   Fuller Plan, MD  Note - This record has been created using [employer].  Chart creation errors have been sought, but may not always  have been located. Such creation errors do not reflect on  the standard of medical care.

## 2024-01-13 NOTE — Telephone Encounter (Signed)
Late entry - spoke with patient yesterday regarding intermittent left temple pain Advised would not be coming from right sided carotid stenosis  Patient has been scheduled for her follow up testing 08/2024

## 2024-01-20 ENCOUNTER — Ambulatory Visit: Payer: Medicaid Other | Attending: Internal Medicine | Admitting: Internal Medicine

## 2024-01-20 ENCOUNTER — Encounter: Payer: Self-pay | Admitting: Internal Medicine

## 2024-01-20 VITALS — BP 137/81 | HR 77 | Resp 14 | Ht 64.0 in | Wt 302.0 lb

## 2024-01-20 DIAGNOSIS — Z79899 Other long term (current) drug therapy: Secondary | ICD-10-CM | POA: Diagnosis not present

## 2024-01-20 DIAGNOSIS — K580 Irritable bowel syndrome with diarrhea: Secondary | ICD-10-CM

## 2024-01-20 DIAGNOSIS — L4050 Arthropathic psoriasis, unspecified: Secondary | ICD-10-CM

## 2024-01-20 DIAGNOSIS — M7918 Myalgia, other site: Secondary | ICD-10-CM | POA: Diagnosis not present

## 2024-01-21 LAB — SEDIMENTATION RATE: Sed Rate: 43 mm/h — ABNORMAL HIGH (ref 0–20)

## 2024-01-21 LAB — C-REACTIVE PROTEIN: CRP: 21.6 mg/L — ABNORMAL HIGH (ref <8.0)

## 2024-02-03 ENCOUNTER — Encounter (HOSPITAL_BASED_OUTPATIENT_CLINIC_OR_DEPARTMENT_OTHER): Payer: Medicaid Other

## 2024-02-03 IMAGING — MR MR KNEE*R* W/O CM
6 series · 40 of 40 positions shown · non-contrast
Comparison: X-ray 01/16/2022.

CLINICAL DATA: Chronic right knee pain.

EXAM:
MRI OF THE RIGHT KNEE WITHOUT CONTRAST
TECHNIQUE: Multiplanar, multisequence MR imaging of the knee was performed. No
intravenous contrast was administered.

[Series 5: T2 fat-sat · axial · right · 4.0mm · 0.62mm/px · z∈[-50,+91]mm · 9 of 33 slices shown (1 of 3)]
[im 1/33]
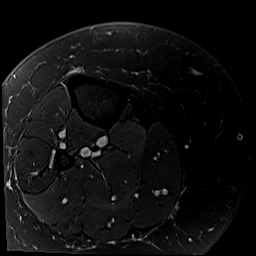
[im 5/33]
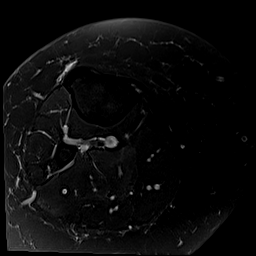
[im 9/33]
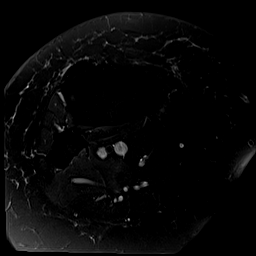
[im 13/33]
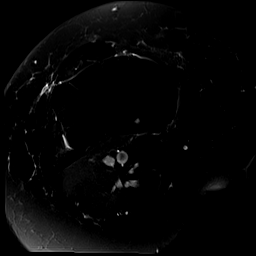
[im 17/33]
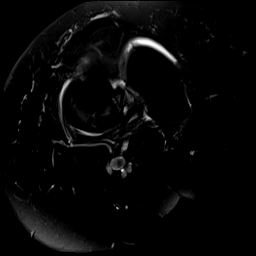
[im 21/33]
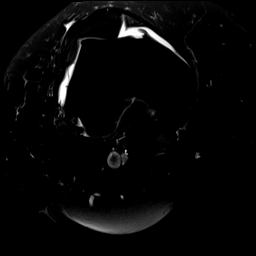
[im 25/33]
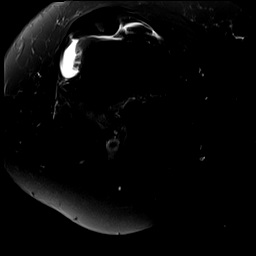
[im 29/33]
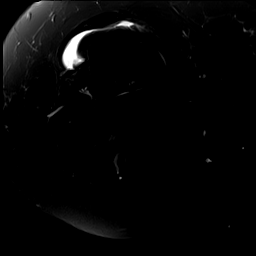
[im 33/33]
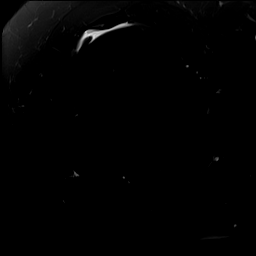

[Series 6: T1 · coronal · right · 4.0mm · 0.62mm/px · 7 of 25 slices shown]
[im 1/25]
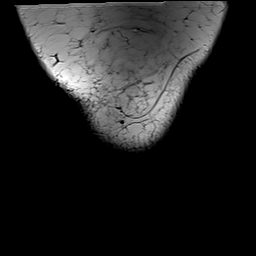
[im 5/25]
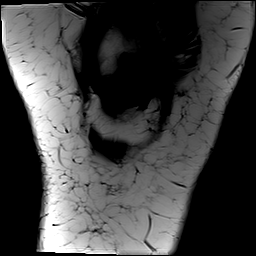
[im 9/25]
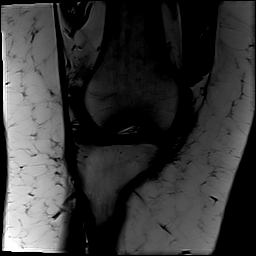
[im 13/25]
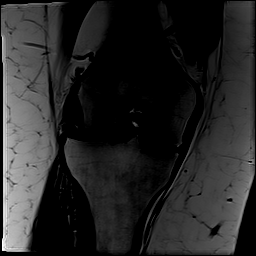
[im 17/25]
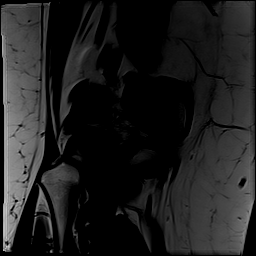
[im 21/25]
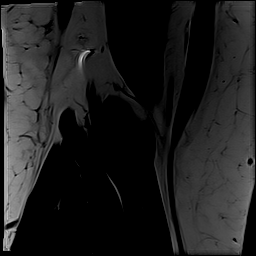
[im 25/25]
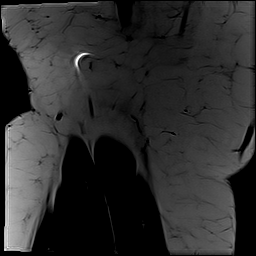

[Series 7: T2 fat-sat · coronal · right · 4.0mm · 0.62mm/px · 6 of 25 slices shown (2 of 3)]
[im 1/25]
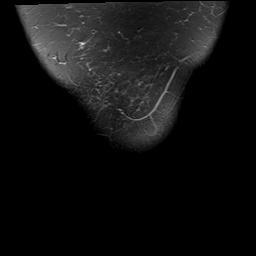
[im 5/25]
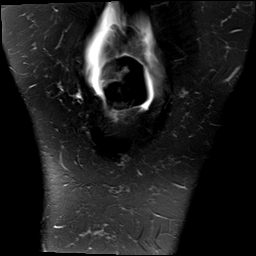
[im 10/25]
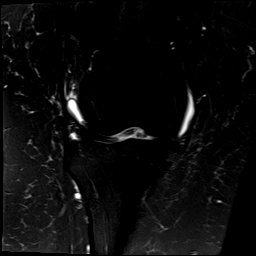
[im 15/25]
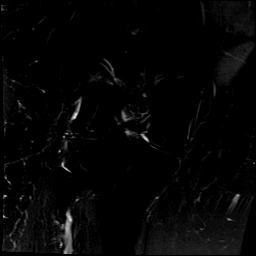
[im 20/25]
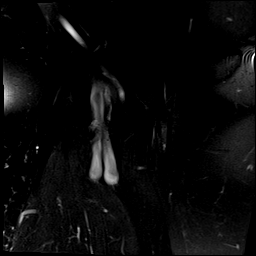
[im 25/25]
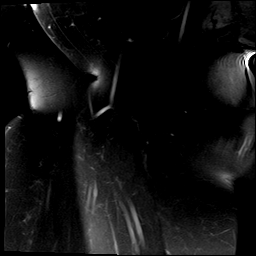

[Series 9: PD fat-sat · sagittal · right · 4.0mm · 0.62mm/px · 5 of 22 slices shown (1 of 2)]
[im 1/22]
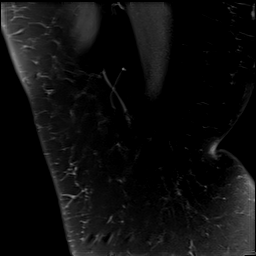
[im 6/22]
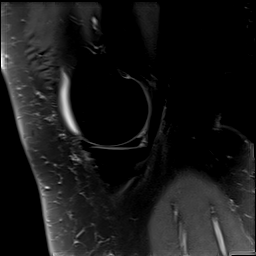
[im 11/22]
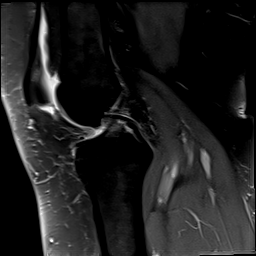
[im 16/22]
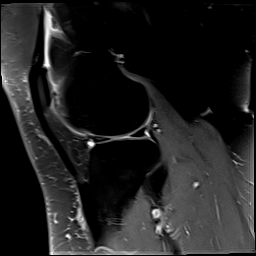
[im 22/22]
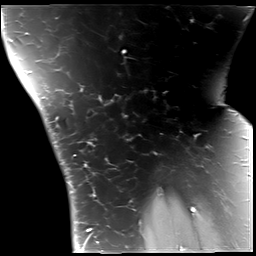

[Series 10: T2 fat-sat · sagittal · right · 4.0mm · 0.62mm/px · 5 of 22 slices shown (3 of 3)]
[im 1/22]
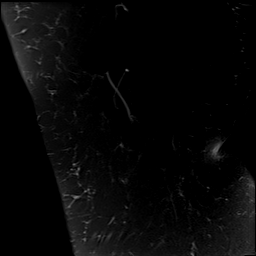
[im 6/22]
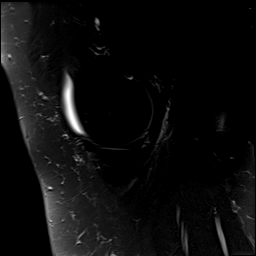
[im 11/22]
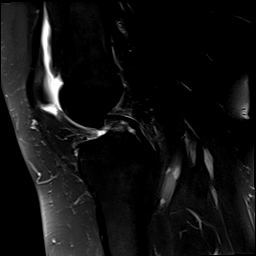
[im 16/22]
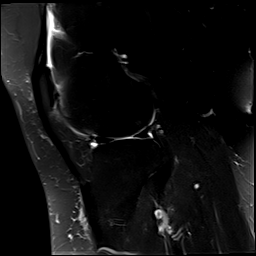
[im 22/22]
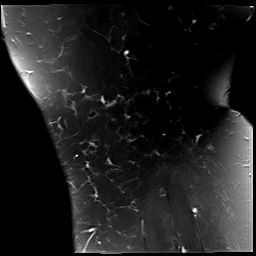

[Series 11: PD fat-sat · coronal · right · 3.0mm · 0.62mm/px · 8 of 34 slices shown (2 of 2)]
[im 1/34]
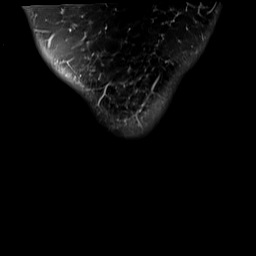
[im 5/34]
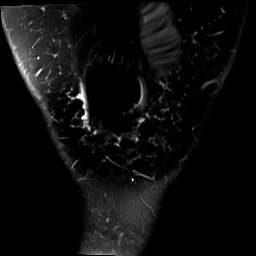
[im 10/34]
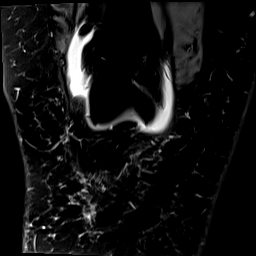
[im 15/34]
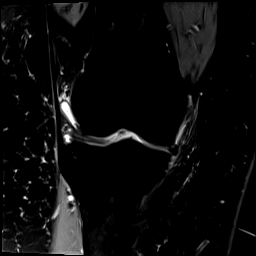
[im 19/34]
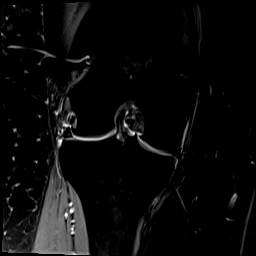
[im 24/34]
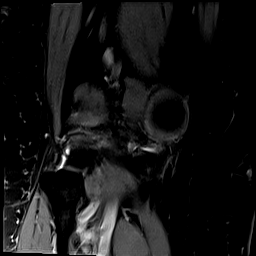
[im 29/34]
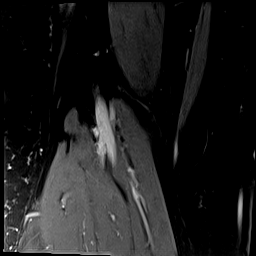
[im 34/34]
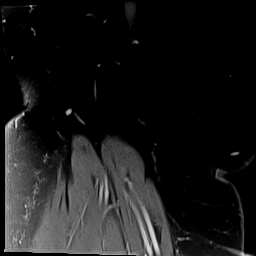

[40 of 40 positions shown; findings below may reference images not displayed]

FINDINGS: MENISCI

Medial meniscus:  Intact.  Borderline extrusion of the body.

Lateral meniscus:  Intact.

LIGAMENTS

Cruciates:  Intact ACL and PCL.

Collaterals: Medial collateral ligament is intact. Lateral
collateral ligament complex is intact.

CARTILAGE

Patellofemoral: Mild partial-thickness cartilage loss over the
lateral patellar facet and lateral trochlea.

Medial:  Mild diffuse cartilage thinning without focal defect.

Lateral:  Mild diffuse cartilage thinning without focal defect.

Joint:  Small joint effusion.  Normal Hoffa's fat.

Popliteal Fossa:  No Baker cyst. Intact popliteus tendon.

Extensor Mechanism: Intact quadriceps tendon and patellar tendon.
Intact medial and lateral patellar retinaculum. Intact MPFL.

Bones: No acute fracture or dislocation. No suspicious bone lesion.
Tiny tricompartmental marginal osteophytes.

Other: None.
IMPRESSION: 1. Borderline extrusion of the medial meniscus body. No meniscal
tear.
2. Mild tricompartmental degenerative changes.
3. Small joint effusion.

## 2024-02-10 ENCOUNTER — Other Ambulatory Visit: Payer: Self-pay | Admitting: Orthopedic Surgery

## 2024-02-10 DIAGNOSIS — I10 Essential (primary) hypertension: Secondary | ICD-10-CM

## 2024-02-10 DIAGNOSIS — I4891 Unspecified atrial fibrillation: Secondary | ICD-10-CM

## 2024-02-10 NOTE — Telephone Encounter (Signed)
Pt was contacted in regard to my chart message. Pt stated that for the last week she has been having an increase in heartburn, diarrhea and generalized upper abdominal pain. Pt stated that her symptoms are worse after eating. Pt stated that she has been averaging about 5 BM a day. Medications reviewed with pt.  Pt stated that she had been taking her Protonix at night. Pt was notified to take Protonix at least 30 minutes before breakfast. GERD precautions reviewed with Pt. Pt verbalized understanding with all questions answered.  Please review and advise if other recommendations:

## 2024-03-06 ENCOUNTER — Other Ambulatory Visit: Payer: Self-pay | Admitting: Orthopedic Surgery

## 2024-03-06 DIAGNOSIS — F4321 Adjustment disorder with depressed mood: Secondary | ICD-10-CM

## 2024-05-03 ENCOUNTER — Other Ambulatory Visit: Payer: Self-pay | Admitting: Orthopedic Surgery

## 2024-05-03 DIAGNOSIS — I10 Essential (primary) hypertension: Secondary | ICD-10-CM

## 2024-05-03 DIAGNOSIS — I4891 Unspecified atrial fibrillation: Secondary | ICD-10-CM

## 2024-05-11 ENCOUNTER — Encounter: Payer: Self-pay | Admitting: Orthopedic Surgery

## 2024-05-18 ENCOUNTER — Other Ambulatory Visit: Payer: Self-pay | Admitting: Orthopedic Surgery

## 2024-05-18 DIAGNOSIS — Z1231 Encounter for screening mammogram for malignant neoplasm of breast: Secondary | ICD-10-CM

## 2024-05-23 ENCOUNTER — Other Ambulatory Visit: Payer: Self-pay | Admitting: Radiology

## 2024-05-23 DIAGNOSIS — N914 Secondary oligomenorrhea: Secondary | ICD-10-CM

## 2024-05-25 NOTE — Telephone Encounter (Signed)
 Med refill request: micronor  Last AEX: 12/30/23 JC Next AEX: not yet scheduled Last MMG (if hormonal med) 06/05/23 Refill authorized: Last Rx sent #[id number] with 4 refills on 12/30/23 JC. Please approve or deny

## 2024-06-07 ENCOUNTER — Encounter

## 2024-06-07 DIAGNOSIS — Z1231 Encounter for screening mammogram for malignant neoplasm of breast: Secondary | ICD-10-CM

## 2024-06-10 ENCOUNTER — Ambulatory Visit
Admission: RE | Admit: 2024-06-10 | Discharge: 2024-06-10 | Disposition: A | Discharge status: Home / Self Care | Payer: PRIVATE HEALTH INSURANCE | Source: Ambulatory Visit | Attending: Orthopedic Surgery | Admitting: Orthopedic Surgery

## 2024-06-10 DIAGNOSIS — Z1231 Encounter for screening mammogram for malignant neoplasm of breast: Secondary | ICD-10-CM

## 2024-06-15 ENCOUNTER — Ambulatory Visit: Payer: Self-pay | Admitting: Orthopedic Surgery

## 2024-07-09 ENCOUNTER — Other Ambulatory Visit: Payer: Self-pay | Admitting: Orthopedic Surgery

## 2024-07-09 DIAGNOSIS — I4891 Unspecified atrial fibrillation: Secondary | ICD-10-CM

## 2024-07-09 DIAGNOSIS — I10 Essential (primary) hypertension: Secondary | ICD-10-CM

## 2024-07-12 DIAGNOSIS — F411 Generalized anxiety disorder: Secondary | ICD-10-CM | POA: Diagnosis not present

## 2024-07-12 DIAGNOSIS — F331 Major depressive disorder, recurrent, moderate: Secondary | ICD-10-CM | POA: Diagnosis not present

## 2024-07-19 ENCOUNTER — Ambulatory Visit: Payer: Medicaid Other | Admitting: Internal Medicine

## 2024-07-19 NOTE — Progress Notes (Signed)
 Office Visit Note  Patient: Phyllis Gentry             Date of Birth: [birth year]           MRN: [id number]             PCP: Gil Greig BRAVO, NP Referring: Gil Greig BRAVO, NP Visit Date: 07/21/2024   Subjective:  Follow-up (Patient states she has some rashes on her back.)   Discussed the use of AI scribe software for clinical note transcription with the patient, who gave verbal consent to proceed.  History of Present Illness   Phyllis Gentry is a 38 y.o. female here for follow up for joint pain in multiple areas possible psoriatic arthritis and chronic myofascial pain.   She has developed a new rash on her back over the past three to four weeks, described as either psoriasis or eczema. Initially, the rash was itchy, with red, scaly spots noted by her husband. There are also a few spots on the front of her body, which are now more faded. She applied cortisone on the first day but has not used any other topical treatments since then.  She is experiencing worsening joint pain, particularly in her hips, described as severe. The pain is also occasionally present in her knees and ankles but is predominantly in the hips. The pain is accompanied by stiffness, making movement difficult. The joint pain has worsened following the discontinuation of Cymbalta , which she was using for neuropathic pain. She has transitioned to Prozac without a tapering period, which has been challenging. She has not been using any oral anti-inflammatory drugs regularly, though she occasionally takes Tylenol or Advil for pain relief.  She experiences difficulty sleeping due to the hip pain, as it disrupts her ability to stay in one position for long. She describes the fatigue as persistent throughout the day despite sleeping.  No new scalp lesions are noted, only a persistent lesion behind her ear that has been present for years.       Previous HPI 01/20/2024 Phyllis Gentry is a 38 y.o. female here for  follow up for joint pain in multiple areas possible psoriatic arthritis and chronic myofascial pain. She presents with recurrent skin abscesses and joint pain.   She has experienced joint pain and hypermobility issues since childhood, with significant pain in the shoulders, neck, and temporal area. She recalls joint issues as a child, including a leg length discrepancy and frequent joint dislocations. Her joints frequently crack and dislocate. She has been diagnosed with temporomandibular joint (TMJ) disorder, experiencing severe pain and stiffness, for which she uses a mouth guard. She also has a history of knee pain, particularly in the right knee. She has been undergoing physical therapy since early last year, focusing on strengthening exercises due to muscle weakness in the affected leg. No recent changes in joint swelling or new areas of pain. She reports significant pain in the temporal area, shoulders, and neck, with occasional sharp pain in the temples. She experiences TMJ pain bilaterally, with pressure and tenderness in the jaw and temple areas. She has difficulty with grip strength and experiences pain when holding a pen. She avoids NSAIDs due to gastritis concerns and primarily uses Tylenol for pain management.   She has a history of recurrent skin abscesses, recent lesions located in the buttock area, with occasional occurrences between the thighs and in the armpits. She is currently dealing with her third abscess, with two active ones. The first  abscess required lancing. These abscesses are larger and more persistent, requiring daily drainage. She has not previously seen dermatology for these issues. There is a family history of similar conditions, as her father experienced similar issues at her age, and she has family members with hidradenitis suppurativa.   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 She  experiences flares of irritable bowel syndrome (IBS) and is currently on a proton pump inhibitor, which she feels helps but does not completely alleviate symptoms. She describes days where she feels something is not right but not painful.   She has a history of polycystic ovary syndrome (PCOS) and does not ovulate. She is on Provera  to manage her menstrual cycle. She also has lymphedema, primarily affecting her knees, ankles, and feet, for which she wears compression garments.   She reports a long-standing issue with elevated inflammatory markers, such as CRP and ESR, which have fluctuated over the past ten years. She experiences silent migraines, characterized by visual disturbances without pain. She is currently taking Cymbalta  30 mg, which she finds helpful for her symptoms, although she is cautious about increasing the dose due to potential side effects.      Previous HPI 10/07/2023 Phyllis Gentry is a 38 y.o. female here for follow up for joint pain in multiple areas possible psoriatic arthritis and chronic myofascial pain.  Testing at initial visit showed mildly elevated sedimentation rate and CRP otherwise unremarkable.  Continues having joint pain and stiffness in multiple areas worst affected joints in her left ankle and in bilateral thumbs.  Also with muscle pain in her neck and back which is chronic.  She does see swelling in the left ankle comes and goes intermittently.  Has severe stiffness in her calves sometimes with cramping sensation.  Cymbalta  was increased from 20 to 30 mg daily.  She took a course of antibiotics for a skin boil in her groin area.  Has been about 6 months since she had any skin infection like this sometimes gets them in the axilla and sometimes in the groin.  No other new rashes on her extremities and no nail changes.   Previous HPI 04/03/23 Phyllis Gentry is a 38 y.o. female here for multiple joint pains including left wrist inflammation osteoarthritis, and  history of possible psoriatic arthritis and fibromyalgia syndrome.  She has a history of joint pain issues dating back to early childhood with extensive early glucocorticoid exposure due to severe allergy and hypersensitivity problems.  This has contributed to early issues with some carotid artery stenosis, and obesity with PCOS and some early degenerative arthritis changes.  She previously saw rheumatology in 2021 with iniital treatment on NSAIDs. Lab workup at that time showing elevated CRP and platelets with negative ANA testing and no radiographic sacroiliitis.  She has generally had some degree of ongoing joint pains typically affecting her neck and back and knees and ankles somewhat varying severity.  Knee pain worse on the right side and often provoked with climbing stairs or getting up from the ground or low furniture positions.  Also tends to have increase in neck and hand pains towards the end of the day.  Has had bilateral carpal tunnel syndrome but does not require any interventions for this and no complications with excessive weakness dropping items or difficulty performing routine tasks.  Referral to this visit was associated with development of significant left wrist pain and swelling with findings of tenosynovitis and orthopedics  clinic.  She did not recall any particular injury activity change or illness preceding onset of symptoms started around late January or beginning of February.  Since the left wrist pain improved she is not having large amounts of visible joint swelling although still has some persistent pain and stiffness.  She takes some Tylenol and ibuprofen as needed but not every day and usually not multiple times per day. Skin disease experiences episodic small patchy rashes usually resolving without any specific long-term treatment.  These were previously suspected as psoriasis but never on a long-term medication.  Currently skin is pretty clear did not see a flareup of rash  associated with the left wrist swelling.  With previous rheumatology evaluation her findings with irregular brittle and thin fingernails with frequent splitting were suspected as related to possible psoriatic disease. Fibromyalgia with frequent pain including muscular stiffness and pain throughout the that stays pretty constantly present.  Usually does not feel widespread sensitivity all over or excessive pain with benign stimuli.  Does have issues with silent migraine pretty often.  Has irritable bowel symptoms predominantly diarrhea.  She was started on Cymbalta  takes 20 mg daily feels this is partially beneficial for her body aches.   Review of Systems  Constitutional:  Positive for fatigue.  HENT:  Positive for mouth dryness. Negative for mouth sores.   Eyes:  Positive for dryness.  Respiratory:  Negative for shortness of breath.   Cardiovascular:  Positive for chest pain and palpitations.  Gastrointestinal:  Negative for blood in stool, constipation and diarrhea.  Endocrine: Negative for increased urination.  Genitourinary:  Negative for involuntary urination.  Musculoskeletal:  Positive for joint pain, gait problem, joint pain, myalgias, muscle weakness, morning stiffness, muscle tenderness and myalgias. Negative for joint swelling.  Skin:  Positive for rash. Negative for color change, hair loss and sensitivity to sunlight.  Allergic/Immunologic: Positive for susceptible to infections.  Neurological:  Positive for dizziness and headaches.  Hematological:  Negative for swollen glands.  Psychiatric/Behavioral:  Positive for depressed mood. Negative for sleep disturbance. The patient is nervous/anxious.     PMFS History:  Patient Active Problem List   Diagnosis Date Noted   Chronic diarrhea 11/13/2023   Rectal bleeding 11/13/2023   Gastroesophageal reflux disease without esophagitis 11/13/2023   Gastritis and gastroduodenitis 11/13/2023   High risk medication use 10/07/2023   Other  fatigue 04/25/2023   Mixed hyperlipidemia 04/25/2023   Prediabetes 04/25/2023   Vitamin D  deficiency 04/25/2023   Myofascial pain syndrome 04/03/2023   Patellofemoral pain syndrome of right knee 04/03/2023   Abscess of skin and subcutaneous tissue 10/13/2022   Carotid artery stenosis, asymptomatic, left 09/18/2022   Generalized anxiety disorder 04/18/2022   Menstrual periods irregular 03/03/2022   Anemia due to folic acid  deficiency 03/03/2022   Recurrent major depressive disorder, in partial remission (HCC) 03/03/2022   Class 3 severe obesity due to excess calories without serious comorbidity with body mass index (BMI) of 50.0 to 59.9 in adult 02/15/2022   Irritable bowel syndrome with diarrhea 01/27/2022   H/O atrial fibrillation without current medication 01/27/2022   Lymphedema 01/27/2022   Polycystic ovary disease 01/27/2022   BMI 50.0-59.9, adult (HCC) 01/27/2022   Fibromyalgia 01/25/2022   Hypertension 01/25/2022   Atrial fibrillation (HCC) 07/07/2021   Anxiety 07/07/2021   Asthma 07/07/2021   Psoriatic arthritis (HCC) 02/10/2021   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, right 10/31/2020   Dizziness 07/10/2020   Pure hypercholesterolemia 07/10/2020   Hereditary lymphedema 07/05/2019  Biliary dyskinesia 04/16/2017   Endometrial polyp 08/27/2016   Menometrorrhagia 08/27/2016   Cervical radiculitis 10/30/2015   Carpal tunnel syndrome 07/03/2015    Past Medical History:  Diagnosis Date   A-fib (HCC)    Anemia    Anxiety    Arthritis    Asthma    Depression    Fibromyalgia    GERD (gastroesophageal reflux disease)    High cholesterol    Hypertension    IBS (irritable bowel syndrome)    Lymphedema    Obesity    PCOS (polycystic ovarian syndrome)    Prediabetes     Family History  Problem Relation Age of Onset   Dementia Mother    Breast cancer Mother        onset 54, 2nd 2016   Diabetes Mother    Alzheimer's disease Mother    Heart disease Father     Diabetes Father    Irritable bowel syndrome Father    Breast cancer Maternal Aunt        onset 50   Colon cancer Maternal Great-grandfather    Breast cancer Other        maternal first cousin, onset 52   Esophageal cancer Neg Hx    Stomach cancer Neg Hx    Past Surgical History:  Procedure Laterality Date   BIOPSY  11/13/2023   Procedure: BIOPSY;  Surgeon: Shila Gustav GAILS, MD;  Location: [facility];  Service: Gastroenterology;;   CHOLECYSTECTOMY     COLONOSCOPY     with endoscopy 2023   COLONOSCOPY WITH PROPOFOL  N/A 11/13/2023   Procedure: COLONOSCOPY WITH PROPOFOL ;  Surgeon: Shila Gustav GAILS, MD;  Location: [facility];  Service: Gastroenterology;  Laterality: N/A;   DILATION AND CURETTAGE OF UTERUS     ESOPHAGOGASTRODUODENOSCOPY (EGD) WITH PROPOFOL  N/A 11/13/2023   Procedure: ESOPHAGOGASTRODUODENOSCOPY (EGD) WITH PROPOFOL ;  Surgeon: Shila Gustav GAILS, MD;  Location: [facility];  Service: Gastroenterology;  Laterality: N/A;   GALLBLADDER SURGERY  2018   Social History   Social History Narrative   Tobacco use, amount per day now: No   Past tobacco use, amount per day: No   How many years did you use tobacco: No   Alcohol use (drinks per week): No   Diet:   Do you drink/eat things with caffeine: Yes   Marital status:   Married                               What year were you married? 2022   Do you live in a house, apartment, assisted living, condo, trailer, etc.? Apartment   Is it one or more stories? 3   How many persons live in your home? 2   Do you have pets in your home?( please list) 2 Cats, 1 Dog   Highest Level of education completed? Bachelors Degree   Current or past profession: Art    Do you exercise?       No                           Type and how often?   Do you have a living will? No   Do you have a DNR form?    No                               If not,  do you want to discuss one?   Do you have signed POA/HPOA forms No                        If so,  please bring to you appointment      Do you have any difficulty bathing or dressing yourself? No   Do you have any difficulty preparing food or eating? No   Do you have any difficulty managing your medications? No   Do you have any difficulty managing your finances? No   Do you have any difficulty affording your medications? No    There is no immunization history on file for this patient.   Objective: Vital Signs: BP 131/84 (BP Location: Left Arm, Patient Position: Sitting, Cuff Size: Large)   Pulse 82   Resp 14   Ht 5' 4 (1.626 m)   Wt (!) 302 lb (137 kg)   BMI 51.84 kg/m    Physical Exam Constitutional:      Appearance: She is obese.  Eyes:     Conjunctiva/sclera: Conjunctivae normal.  Cardiovascular:     Rate and Rhythm: Normal rate and regular rhythm.  Pulmonary:     Effort: Pulmonary effort is normal.     Breath sounds: Normal breath sounds.  Skin:    General: Skin is warm and dry.     Findings: No rash.  Neurological:     Mental Status: She is alert.  Psychiatric:        Mood and Affect: Mood normal.      Musculoskeletal Exam:  Shoulders full ROM no tenderness or swelling Elbows full ROM no tenderness or swelling Wrists full ROM no tenderness or swelling Fingers full ROM no tenderness or swelling Hip restricted internal and external rotation, no pain with movement no focal tenderness to pressure Knees full ROM, right knee tenderness to pressure on anterior, worse at quadriceps and patellar tendons Ankles full ROM no tenderness or swelling    Investigation: No additional findings.  Imaging: No results found.  Recent Labs: Lab Results  Component Value Date   WBC 8.9 08/14/2023   HGB 12.3 08/14/2023   PLT 464 (H) 08/14/2023   NA 138 08/14/2023   K 4.7 08/14/2023   CL 102 08/14/2023   CO2 30 08/14/2023   GLUCOSE 93 08/14/2023   BUN 12 08/14/2023   CREATININE 0.72 08/14/2023   BILITOT 0.4 08/14/2023   ALKPHOS 113 03/27/2023   AST 14 08/14/2023    ALT 12 08/14/2023   PROT 7.3 08/14/2023   ALBUMIN 4.3 03/27/2023   CALCIUM  9.4 08/14/2023    Speciality Comments: No specialty comments available.  Procedures:  No procedures performed Allergies: Aminophylline, Cefaclor, Cephalosporins, Diphenhydramine, Sulfa antibiotics, Clarithromycin, Iodinated contrast media, and Iodine   Assessment / Plan:     Visit Diagnoses: Psoriatic arthritis (HCC)  New pruritic rash on back, suspected psoriasis or eczema New pruritic rash on back, resolving. Differential includes psoriasis or eczema. Previous cortisone cream use, no further treatment needed currently. Discontinued cymbalta  associated with medication changes with some increase in pain. No appreciabel synovitis or dactylitis currently. - Monitor rash and report if it worsens or spreads. - Consider stronger topical medication if rash persists or worsens.   Myofascial pain syndrome - Plan: cyclobenzaprine  (FLEXERIL ) 10 MG tablet Chronic bilateral hip pain and polyarthralgia Chronic hip pain with stiffness and sleep disruption, worsened post-Cymbalta  discontinuation. Current Tylenol and Advil provide partial relief. Considering muscle relaxants for nighttime due to muscular  pain. Prefers symptom monitoring over aggressive treatment. - Prescribed Flexeril  for nighttime use to manage hip pain and improve sleep. - Consider meloxicam or diclofenac if stronger anti-inflammatory is needed.   Orders: No orders of the defined types were placed in this encounter.  Meds ordered this encounter  Medications   cyclobenzaprine  (FLEXERIL ) 10 MG tablet    Sig: Take 1 tablet (10 mg total) by mouth at bedtime as needed.    Dispense:  30 tablet    Refill:  1     Follow-Up Instructions: Return in about 6 months (around 01/21/2025) for PsA/FMS obs f/u 6mos.   Lonni LELON Ester, MD  Note - This record has been created using [employer].  Chart creation errors have been sought, but may not always   have been located. Such creation errors do not reflect on  the standard of medical care.

## 2024-07-21 ENCOUNTER — Ambulatory Visit: Attending: Internal Medicine | Admitting: Internal Medicine

## 2024-07-21 ENCOUNTER — Encounter: Payer: Self-pay | Admitting: Internal Medicine

## 2024-07-21 VITALS — BP 131/84 | HR 82 | Resp 14 | Ht 64.0 in | Wt 302.0 lb

## 2024-07-21 DIAGNOSIS — I890 Lymphedema, not elsewhere classified: Secondary | ICD-10-CM | POA: Diagnosis not present

## 2024-07-21 DIAGNOSIS — L4050 Arthropathic psoriasis, unspecified: Secondary | ICD-10-CM | POA: Diagnosis not present

## 2024-07-21 DIAGNOSIS — M7918 Myalgia, other site: Secondary | ICD-10-CM | POA: Insufficient documentation

## 2024-07-21 DIAGNOSIS — K580 Irritable bowel syndrome with diarrhea: Secondary | ICD-10-CM | POA: Insufficient documentation

## 2024-07-21 MED ORDER — CYCLOBENZAPRINE HCL 10 MG PO TABS: 10.0000 mg | ORAL_TABLET | Freq: Every evening | ORAL | 1 refills | Status: DC | PRN

## 2024-07-22 ENCOUNTER — Other Ambulatory Visit: Payer: Self-pay | Admitting: Orthopedic Surgery

## 2024-07-22 DIAGNOSIS — F411 Generalized anxiety disorder: Secondary | ICD-10-CM | POA: Diagnosis not present

## 2024-07-22 DIAGNOSIS — E1165 Type 2 diabetes mellitus with hyperglycemia: Secondary | ICD-10-CM

## 2024-07-22 DIAGNOSIS — F331 Major depressive disorder, recurrent, moderate: Secondary | ICD-10-CM | POA: Diagnosis not present

## 2024-07-28 DIAGNOSIS — F411 Generalized anxiety disorder: Secondary | ICD-10-CM | POA: Diagnosis not present

## 2024-07-28 DIAGNOSIS — F331 Major depressive disorder, recurrent, moderate: Secondary | ICD-10-CM | POA: Diagnosis not present

## 2024-08-02 DIAGNOSIS — F900 Attention-deficit hyperactivity disorder, predominantly inattentive type: Secondary | ICD-10-CM | POA: Diagnosis not present

## 2024-08-02 DIAGNOSIS — F331 Major depressive disorder, recurrent, moderate: Secondary | ICD-10-CM | POA: Diagnosis not present

## 2024-08-02 DIAGNOSIS — F411 Generalized anxiety disorder: Secondary | ICD-10-CM | POA: Diagnosis not present

## 2024-08-05 ENCOUNTER — Other Ambulatory Visit: Payer: Self-pay | Admitting: Orthopedic Surgery

## 2024-08-05 ENCOUNTER — Encounter: Payer: Self-pay | Admitting: Orthopedic Surgery

## 2024-08-05 ENCOUNTER — Ambulatory Visit (INDEPENDENT_AMBULATORY_CARE_PROVIDER_SITE_OTHER): Admitting: Orthopedic Surgery

## 2024-08-05 VITALS — BP 140/88 | HR 74 | Temp 97.6°F | Ht 64.0 in | Wt 305.0 lb

## 2024-08-05 DIAGNOSIS — L309 Dermatitis, unspecified: Secondary | ICD-10-CM | POA: Diagnosis not present

## 2024-08-05 DIAGNOSIS — I4891 Unspecified atrial fibrillation: Secondary | ICD-10-CM

## 2024-08-05 DIAGNOSIS — I10 Essential (primary) hypertension: Secondary | ICD-10-CM

## 2024-08-05 MED ORDER — TRIAMCINOLONE ACETONIDE 0.1 % EX CREA: 1.0000 | TOPICAL_CREAM | Freq: Two times a day (BID) | CUTANEOUS | 2 refills | Status: AC

## 2024-08-05 NOTE — Patient Instructions (Addendum)
 Shower with luke warm water/ not scalding hot  Apply Cerave healing ointment or plain vaseline  Will send triamcinolone cream as back up treatment

## 2024-08-05 NOTE — Progress Notes (Signed)
 Careteam: Patient Care Team: Phyllis Gentry BRAVO, NP as PCP - General (Adult Health Nurse Practitioner) Phyllis Slain, MD as PCP - Cardiology (Cardiology) Phyllis Shasta NOVAK, NP as Nurse Practitioner (Obstetrics and Gynecology) Phyllis Standing, MD as Consulting Physician (Orthopedic Surgery) Phyllis Slain, MD as Consulting Physician (Cardiology)  Seen by: Phyllis Gentry, AGNP-C  PLACE OF SERVICE:  [facility]  Advanced Directive information    Allergies  Allergen Reactions   Aminophylline Anaphylaxis   Cefaclor Anaphylaxis and Other (See Comments)   Cephalosporins Anaphylaxis   Diphenhydramine Anaphylaxis   Sulfa Antibiotics Anaphylaxis   Clarithromycin    Iodinated Contrast Media    Iodine     Other reaction(s): Unknown    Chief Complaint  Patient presents with   Medical Management of Chronic Issues    Routine visit.  Wants you to look at a couple of spots on her to see if it may be Ecezema or Psoriasis. She also states that her inflammation markers are always trending high. mar Discuss the need for HIV screening, Influenza vaccine, Covid vaccine, DTAP vaccine, Pne vaccine, Hep B vaccine, and HPV vaccine.     HPI: Patient is a 38 y.o. female seen today for acute visit due to due to dry skin.   Discussed the use of AI scribe software for clinical note transcription with the patient, who gave verbal consent to proceed.  History of Present Illness    Followed by rheumatology, previously diagnosed with psoriatic arthritis, myofascial pain syndrome, and IBS.   She has been experiencing a flare of her eczema for the past couple of months. Initially, the affected areas were very red, but the redness has since subsided, leaving behind dry patches. These patches are located on her back, particularly the upper shoulder blade area, and on her stomach. The stomach patch has increased in size over time. She has a history of eczema, but it was previously limited to a small  area in the same location throughout her life.  She has used over-the-counter cortisone cream when the redness was prominent, which provided some relief. Currently, the eczema is not significantly bothersome, though it is slightly itchy. She has not changed any toiletries, lotions, or detergents, maintaining a consistent regimen.  Her showers are typically quite hot. 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      Review of Systems:  Review of Systems  Constitutional: Negative.   Respiratory: Negative.    Cardiovascular: Negative.   Skin:  Positive for itching and rash.  Psychiatric/Behavioral: Negative.      Past Medical History:  Diagnosis Date   A-fib (HCC)    Anemia    Anxiety    Arthritis    Asthma    Depression    Fibromyalgia    GERD (gastroesophageal reflux disease)    High cholesterol    Hypertension    IBS (irritable bowel syndrome)    Lymphedema    Obesity    PCOS (polycystic ovarian syndrome)    Prediabetes    Past Surgical History:  Procedure Laterality Date   BIOPSY  11/13/2023   Procedure: BIOPSY;  Surgeon: Phyllis Gustav GAILS, MD;  Location: [facility];  Service: Gastroenterology;;   CHOLECYSTECTOMY     COLONOSCOPY     with endoscopy 2023   COLONOSCOPY WITH PROPOFOL N/A 11/13/2023   Procedure: COLONOSCOPY WITH PROPOFOL;  Surgeon: Phyllis Gustav GAILS, MD;  Location: [facility];  Service: Gastroenterology;  Laterality: N/A;   DILATION  AND CURETTAGE OF UTERUS     ESOPHAGOGASTRODUODENOSCOPY (EGD) WITH PROPOFOL N/A 11/13/2023   Procedure: ESOPHAGOGASTRODUODENOSCOPY (EGD) WITH PROPOFOL;  Surgeon: Phyllis Gustav GAILS, MD;  Location: [facility];  Service: Gastroenterology;  Laterality: N/A;   GALLBLADDER SURGERY  2018   Social History:   reports that she has never smoked. She has been exposed to tobacco smoke. She has never used smokeless tobacco. She reports that she does not currently  use alcohol. She reports that she does not currently use drugs.  Family History  Problem Relation Age of Onset   Dementia Mother    Breast cancer Mother        onset 36, 2nd 2016   Diabetes Mother    Alzheimer's disease Mother    Heart disease Father    Diabetes Father    Irritable bowel syndrome Father    Breast cancer Maternal Aunt        onset 49   Colon cancer Maternal Great-grandfather    Breast cancer Other        maternal first cousin, onset 60   Esophageal cancer Neg Hx    Stomach cancer Neg Hx     Medications: Patient's Medications  New Prescriptions   No medications on file  Previous Medications   ALBUTEROL (PROVENTIL) (2.5 MG/3ML) 0.083% NEBULIZER SOLUTION    Take 3 mLs (2.5 mg total) by nebulization every 6 (six) hours as needed for wheezing or shortness of breath.   ALBUTEROL (VENTOLIN HFA) 108 (90 BASE) MCG/ACT INHALER    INHALE 2 PUFFS BY MOUTH EVERY 4 HOURS AS NEEDED FOR WHEEZING OR SHORTNESS OF BREATH   BROMELAIN 250 MG CAPS    Take 1 tablet by mouth daily.   CLINDAMYCIN (CLEOCIN T) 1 % LOTION    APPLY TO AFFECTED AREAS DAILY DIRECTLY AFTER SHOWER   COENZYME Q10 (COQ10 PO)    Take 200 mg by mouth daily.   COLESTIPOL (COLESTID) 1 G TABLET    Take 1 tablet (1 g total) by mouth 4 (four) times daily -  before meals and at bedtime.   CYANOCOBALAMIN 1000 MCG SUBL    PLACE 1 TABLET(S) EVERY DAY BY SUBLINGUAL ROUTE.   CYCLOBENZAPRINE (FLEXERIL) 10 MG TABLET    Take 1 tablet (10 mg total) by mouth at bedtime as needed.   DICYCLOMINE (BENTYL) 20 MG TABLET    Take 1 tablet (20 mg total) by mouth every 6 (six) hours as needed for spasms.   DULOXETINE (CYMBALTA) 30 MG CAPSULE    TAKE 1 CAPSULE(30 MG) BY MOUTH DAILY   FEXOFENADINE (ALLEGRA) 180 MG TABLET       FLUOXETINE (PROZAC) 20 MG CAPSULE    Take 20 mg by mouth daily.   FOLIC ACID (FOLVITE) 1 MG TABLET    TAKE 1 TABLET(1 MG) BY MOUTH DAILY   FOLIC ACID (FOLVITE) 1 MG TABLET    Take 1 tablet (1 mg total) by mouth daily.    MEDROXYPROGESTERONE (PROVERA) 10 MG TABLET    Take 10 mg by mouth as needed.   METFORMIN (GLUCOPHAGE-XR) 500 MG 24 HR TABLET    TAKE 1 TABLET(500 MG) BY MOUTH DAILY WITH BREAKFAST   NORETHINDRONE (MICRONOR) 0.35 MG TABLET    TAKE 1 TABLET(0.35 MG) BY MOUTH DAILY   OMEGA-3 FATTY ACIDS (FISH OIL PO)    Take by mouth.   OVER THE COUNTER MEDICATION    Take 1,320 mg by mouth every 30 (thirty) days. Caprylic Acid   PANTOPRAZOLE (PROTONIX) 40 MG TABLET  Take 1 tablet (40 mg total) by mouth daily.   PROPRANOLOL (INDERAL) 40 MG TABLET    Take 1 tablet (40 mg total) by mouth 2 (two) times daily. APPOINTMENT DUE   ROSUVASTATIN (CRESTOR) 20 MG TABLET    TAKE 1 TABLET(20 MG) BY MOUTH DAILY  Modified Medications   No medications on file  Discontinued Medications   No medications on file    Physical Exam:  Vitals:   08/05/24 1536 08/05/24 1541  BP: (!) 130/90 (!) 140/88  Pulse: 74   Temp: 97.6 F (36.4 C)   SpO2: 98%   Weight: (!) 305 lb (138.3 kg)   Height: 5' 4 (1.626 m)    Body mass index is 52.35 kg/m. Wt Readings from Last 3 Encounters:  08/05/24 (!) 305 lb (138.3 kg)  07/21/24 (!) 302 lb (137 kg)  01/20/24 (!) 302 lb (137 kg)    Physical Exam Vitals reviewed.  Constitutional:      Appearance: She is obese.  HENT:     Head: Normocephalic.  Eyes:     General:        Right eye: No discharge.        Left eye: No discharge.  Cardiovascular:     Rate and Rhythm: Normal rate and regular rhythm.     Pulses: Normal pulses.     Heart sounds: Normal heart sounds.  Pulmonary:     Effort: Pulmonary effort is normal.     Breath sounds: Normal breath sounds.  Musculoskeletal:     Cervical back: Neck supple.  Skin:    General: Skin is warm.     Capillary Refill: Capillary refill takes less than 2 seconds.     Findings: Rash present.     Comments: Small pinpoint bumps over left and right shoulder blades, no erythema or skin breakdown. Area of scaly skin to left middle abdomen, no  erythema or skin breakdown.   Neurological:     General: No focal deficit present.     Mental Status: She is alert and oriented to person, place, and time.  Psychiatric:        Mood and Affect: Mood normal.      Labs reviewed: Basic Metabolic Panel: Recent Labs    08/14/23 1012  NA 138  K 4.7  CL 102  CO2 30  GLUCOSE 93  BUN 12  CREATININE 0.72  CALCIUM 9.4   Liver Function Tests: Recent Labs    08/14/23 1012  AST 14  ALT 12  BILITOT 0.4  PROT 7.3   No results for input(s): LIPASE, AMYLASE in the last 8760 hours. No results for input(s): AMMONIA in the last 8760 hours. CBC: Recent Labs    08/14/23 1012  WBC 8.9  NEUTROABS 5,687  HGB 12.3  HCT 38.8  MCV 84.2  PLT 464*   Lipid Panel: Recent Labs    08/14/23 1012  CHOL 128  HDL 45*  LDLCALC 65  TRIG 92  CHOLHDL 2.8   TSH: No results for input(s): TSH in the last 8760 hours. A1C: Lab Results  Component Value Date   HGBA1C 6.4 (H) 08/14/2023     Assessment/Plan 1. Eczema, unspecified type (Primary) - noted to shoulder blades and abdomen - recommend Vaseline or Cerave healing ointment after showering - avoid hot water when showering - may try Kenalog cream for itching - triamcinolone cream (KENALOG) 0.1 %; Apply 1 Application topically 2 (two) times daily.  Dispense: 30 g; Refill: 2  Total time: 15  minutes. Greater than 50% of total time spent doing patient education regarding eczema including symptom/medication management.    Next appt: none Phyllis Gentry  [facility] [phone]

## 2024-08-06 MED ORDER — CLINDAMYCIN PHOSPHATE 1 % EX LOTN: TOPICAL_LOTION | CUTANEOUS | Status: AC | PRN

## 2024-08-11 DIAGNOSIS — F900 Attention-deficit hyperactivity disorder, predominantly inattentive type: Secondary | ICD-10-CM | POA: Diagnosis not present

## 2024-08-11 DIAGNOSIS — F411 Generalized anxiety disorder: Secondary | ICD-10-CM | POA: Diagnosis not present

## 2024-08-11 DIAGNOSIS — F331 Major depressive disorder, recurrent, moderate: Secondary | ICD-10-CM | POA: Diagnosis not present

## 2024-08-16 DIAGNOSIS — F411 Generalized anxiety disorder: Secondary | ICD-10-CM | POA: Diagnosis not present

## 2024-08-16 DIAGNOSIS — F900 Attention-deficit hyperactivity disorder, predominantly inattentive type: Secondary | ICD-10-CM | POA: Diagnosis not present

## 2024-08-16 DIAGNOSIS — F331 Major depressive disorder, recurrent, moderate: Secondary | ICD-10-CM | POA: Diagnosis not present

## 2024-08-21 ENCOUNTER — Other Ambulatory Visit: Payer: Self-pay | Admitting: Medical Genetics

## 2024-08-24 ENCOUNTER — Other Ambulatory Visit (HOSPITAL_COMMUNITY)
Admission: RE | Admit: 2024-08-24 | Discharge: 2024-08-24 | Disposition: A | Discharge status: Home / Self Care | Payer: Self-pay | Source: Ambulatory Visit | Attending: Oncology | Admitting: Oncology

## 2024-08-26 ENCOUNTER — Encounter (HOSPITAL_BASED_OUTPATIENT_CLINIC_OR_DEPARTMENT_OTHER): Payer: Self-pay

## 2024-08-26 DIAGNOSIS — F411 Generalized anxiety disorder: Secondary | ICD-10-CM | POA: Diagnosis not present

## 2024-08-26 DIAGNOSIS — F900 Attention-deficit hyperactivity disorder, predominantly inattentive type: Secondary | ICD-10-CM | POA: Diagnosis not present

## 2024-08-26 DIAGNOSIS — F331 Major depressive disorder, recurrent, moderate: Secondary | ICD-10-CM | POA: Diagnosis not present

## 2024-08-27 ENCOUNTER — Encounter (HOSPITAL_BASED_OUTPATIENT_CLINIC_OR_DEPARTMENT_OTHER): Payer: Medicaid Other

## 2024-08-27 DIAGNOSIS — I6521 Occlusion and stenosis of right carotid artery: Secondary | ICD-10-CM | POA: Diagnosis not present

## 2024-08-27 DIAGNOSIS — F900 Attention-deficit hyperactivity disorder, predominantly inattentive type: Secondary | ICD-10-CM | POA: Diagnosis not present

## 2024-08-27 DIAGNOSIS — F411 Generalized anxiety disorder: Secondary | ICD-10-CM | POA: Diagnosis not present

## 2024-08-27 DIAGNOSIS — F331 Major depressive disorder, recurrent, moderate: Secondary | ICD-10-CM | POA: Diagnosis not present

## 2024-08-30 ENCOUNTER — Ambulatory Visit (HOSPITAL_BASED_OUTPATIENT_CLINIC_OR_DEPARTMENT_OTHER): Payer: Self-pay | Admitting: Family

## 2024-08-31 LAB — GENECONNECT MOLECULAR SCREEN: Genetic Analysis Overall Interpretation: NEGATIVE

## 2024-09-06 DIAGNOSIS — F411 Generalized anxiety disorder: Secondary | ICD-10-CM | POA: Diagnosis not present

## 2024-09-06 DIAGNOSIS — F331 Major depressive disorder, recurrent, moderate: Secondary | ICD-10-CM | POA: Diagnosis not present

## 2024-09-06 DIAGNOSIS — F900 Attention-deficit hyperactivity disorder, predominantly inattentive type: Secondary | ICD-10-CM | POA: Diagnosis not present

## 2024-09-13 DIAGNOSIS — F411 Generalized anxiety disorder: Secondary | ICD-10-CM | POA: Diagnosis not present

## 2024-09-13 DIAGNOSIS — F331 Major depressive disorder, recurrent, moderate: Secondary | ICD-10-CM | POA: Diagnosis not present

## 2024-09-13 DIAGNOSIS — F900 Attention-deficit hyperactivity disorder, predominantly inattentive type: Secondary | ICD-10-CM | POA: Diagnosis not present

## 2024-09-14 DIAGNOSIS — F331 Major depressive disorder, recurrent, moderate: Secondary | ICD-10-CM | POA: Diagnosis not present

## 2024-09-14 DIAGNOSIS — F411 Generalized anxiety disorder: Secondary | ICD-10-CM | POA: Diagnosis not present

## 2024-09-15 ENCOUNTER — Other Ambulatory Visit (HOSPITAL_BASED_OUTPATIENT_CLINIC_OR_DEPARTMENT_OTHER): Payer: Self-pay | Admitting: Cardiology

## 2024-09-15 DIAGNOSIS — E782 Mixed hyperlipidemia: Secondary | ICD-10-CM

## 2024-09-15 DIAGNOSIS — I6522 Occlusion and stenosis of left carotid artery: Secondary | ICD-10-CM

## 2024-09-20 DIAGNOSIS — F900 Attention-deficit hyperactivity disorder, predominantly inattentive type: Secondary | ICD-10-CM | POA: Diagnosis not present

## 2024-09-20 DIAGNOSIS — F331 Major depressive disorder, recurrent, moderate: Secondary | ICD-10-CM | POA: Diagnosis not present

## 2024-09-20 DIAGNOSIS — F411 Generalized anxiety disorder: Secondary | ICD-10-CM | POA: Diagnosis not present

## 2024-10-04 DIAGNOSIS — F411 Generalized anxiety disorder: Secondary | ICD-10-CM | POA: Diagnosis not present

## 2024-10-04 DIAGNOSIS — F331 Major depressive disorder, recurrent, moderate: Secondary | ICD-10-CM | POA: Diagnosis not present

## 2024-10-04 DIAGNOSIS — F900 Attention-deficit hyperactivity disorder, predominantly inattentive type: Secondary | ICD-10-CM | POA: Diagnosis not present

## 2024-10-07 DIAGNOSIS — F900 Attention-deficit hyperactivity disorder, predominantly inattentive type: Secondary | ICD-10-CM | POA: Diagnosis not present

## 2024-10-07 DIAGNOSIS — F331 Major depressive disorder, recurrent, moderate: Secondary | ICD-10-CM | POA: Diagnosis not present

## 2024-10-07 DIAGNOSIS — F411 Generalized anxiety disorder: Secondary | ICD-10-CM | POA: Diagnosis not present

## 2024-10-20 DIAGNOSIS — F411 Generalized anxiety disorder: Secondary | ICD-10-CM | POA: Diagnosis not present

## 2024-10-20 DIAGNOSIS — F900 Attention-deficit hyperactivity disorder, predominantly inattentive type: Secondary | ICD-10-CM | POA: Diagnosis not present

## 2024-10-20 DIAGNOSIS — F331 Major depressive disorder, recurrent, moderate: Secondary | ICD-10-CM | POA: Diagnosis not present

## 2024-11-09 DIAGNOSIS — F411 Generalized anxiety disorder: Secondary | ICD-10-CM | POA: Diagnosis not present

## 2024-11-09 DIAGNOSIS — F331 Major depressive disorder, recurrent, moderate: Secondary | ICD-10-CM | POA: Diagnosis not present

## 2024-11-09 DIAGNOSIS — F900 Attention-deficit hyperactivity disorder, predominantly inattentive type: Secondary | ICD-10-CM | POA: Diagnosis not present

## 2024-12-02 DIAGNOSIS — F900 Attention-deficit hyperactivity disorder, predominantly inattentive type: Secondary | ICD-10-CM | POA: Diagnosis not present

## 2024-12-02 DIAGNOSIS — F331 Major depressive disorder, recurrent, moderate: Secondary | ICD-10-CM | POA: Diagnosis not present

## 2024-12-02 DIAGNOSIS — F411 Generalized anxiety disorder: Secondary | ICD-10-CM | POA: Diagnosis not present

## 2024-12-08 ENCOUNTER — Other Ambulatory Visit: Payer: Self-pay | Admitting: Orthopedic Surgery

## 2024-12-08 DIAGNOSIS — D529 Folate deficiency anemia, unspecified: Secondary | ICD-10-CM

## 2024-12-13 ENCOUNTER — Encounter: Payer: Self-pay | Admitting: Orthopedic Surgery

## 2025-01-03 ENCOUNTER — Other Ambulatory Visit: Payer: Self-pay | Admitting: Cardiology

## 2025-01-03 DIAGNOSIS — E782 Mixed hyperlipidemia: Secondary | ICD-10-CM

## 2025-01-03 DIAGNOSIS — I6522 Occlusion and stenosis of left carotid artery: Secondary | ICD-10-CM

## 2025-01-04 MED ORDER — ROSUVASTATIN CALCIUM 20 MG PO TABS: 20.0000 mg | ORAL_TABLET | Freq: Every day | ORAL | 0 refills | Status: AC

## 2025-01-06 NOTE — Progress Notes (Unsigned)
 "  Office Visit Note  Patient: Phyllis Gentry             Date of Birth: 1986/01/21           MRN: [id number]             PCP: Gil Greig BRAVO, NP Referring: Gil Greig BRAVO, NP Visit Date: 01/19/2025   Subjective:  No chief complaint on file.   History of Present Illness: Phyllis Gentry is a 39 y.o. female here for follow up for joint pain in multiple areas possible psoriatic arthritis and chronic myofascial pain.    Previous HPI 07/21/2024 Phyllis Gentry is a 39 y.o. female here for follow up for joint pain in multiple areas possible psoriatic arthritis and chronic myofascial pain.    She has developed a new rash on her back over the past three to four weeks, described as either psoriasis or eczema. Initially, the rash was itchy, with red, scaly spots noted by her husband. There are also a few spots on the front of her body, which are now more faded. She applied cortisone on the first day but has not used any other topical treatments since then.   She is experiencing worsening joint pain, particularly in her hips, described as severe. The pain is also occasionally present in her knees and ankles but is predominantly in the hips. The pain is accompanied by stiffness, making movement difficult. The joint pain has worsened following the discontinuation of Cymbalta , which she was using for neuropathic pain. She has transitioned to Prozac without a tapering period, which has been challenging. She has not been using any oral anti-inflammatory drugs regularly, though she occasionally takes Tylenol or Advil for pain relief.   She experiences difficulty sleeping due to the hip pain, as it disrupts her ability to stay in one position for long. She describes the fatigue as persistent throughout the day despite sleeping.   No new scalp lesions are noted, only a persistent lesion behind her ear that has been present for years.         Previous HPI 01/20/2024 Phyllis Gentry is a 39  y.o. female here for follow up for joint pain in multiple areas possible psoriatic arthritis and chronic myofascial pain. She presents with recurrent skin abscesses and joint pain.   She has experienced joint pain and hypermobility issues since childhood, with significant pain in the shoulders, neck, and temporal area. She recalls joint issues as a child, including a leg length discrepancy and frequent joint dislocations. Her joints frequently crack and dislocate. She has been diagnosed with temporomandibular joint (TMJ) disorder, experiencing severe pain and stiffness, for which she uses a mouth guard. She also has a history of knee pain, particularly in the right knee. She has been undergoing physical therapy since early last year, focusing on strengthening exercises due to muscle weakness in the affected leg. No recent changes in joint swelling or new areas of pain. She reports significant pain in the temporal area, shoulders, and neck, with occasional sharp pain in the temples. She experiences TMJ pain bilaterally, with pressure and tenderness in the jaw and temple areas. She has difficulty with grip strength and experiences pain when holding a pen. She avoids NSAIDs due to gastritis concerns and primarily uses Tylenol for pain management.   She has a history of recurrent skin abscesses, recent lesions located in the buttock area, with occasional occurrences between the thighs and in the armpits. She is currently dealing  with her third abscess, with two active ones. The first abscess required lancing. These abscesses are larger and more persistent, requiring daily drainage. She has not previously seen dermatology for these issues. There is a family history of similar conditions, as her father experienced similar issues at her age, and she has family members with hidradenitis suppurativa.   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 She experiences flares of irritable bowel syndrome (IBS) and is currently on a proton pump inhibitor, which she feels helps but does not completely alleviate symptoms. She describes days where she feels something is not right but not painful.   She has a history of polycystic ovary syndrome (PCOS) and does not ovulate. She is on Provera  to manage her menstrual cycle. She also has lymphedema, primarily affecting her knees, ankles, and feet, for which she wears compression garments.   She reports a long-standing issue with elevated inflammatory markers, such as CRP and ESR, which have fluctuated over the past ten years. She experiences silent migraines, characterized by visual disturbances without pain. She is currently taking Cymbalta  30 mg, which she finds helpful for her symptoms, although she is cautious about increasing the dose due to potential side effects.      Previous HPI 10/07/2023 Phyllis Gentry is a 39 y.o. female here for follow up for joint pain in multiple areas possible psoriatic arthritis and chronic myofascial pain.  Testing at initial visit showed mildly elevated sedimentation rate and CRP otherwise unremarkable.  Continues having joint pain and stiffness in multiple areas worst affected joints in her left ankle and in bilateral thumbs.  Also with muscle pain in her neck and back which is chronic.  She does see swelling in the left ankle comes and goes intermittently.  Has severe stiffness in her calves sometimes with cramping sensation.  Cymbalta  was increased from 20 to 30 mg daily.  She took a course of antibiotics for a skin boil in her groin area.  Has been about 6 months since she had any skin infection like this sometimes gets them in the axilla and sometimes in the groin.  No other new rashes on her extremities and no nail changes.   Previous HPI 04/03/23 Phyllis Gentry is a 39 y.o. female here for multiple joint pains including left wrist inflammation  osteoarthritis, and history of possible psoriatic arthritis and fibromyalgia syndrome.  She has a history of joint pain issues dating back to early childhood with extensive early glucocorticoid exposure due to severe allergy and hypersensitivity problems.  This has contributed to early issues with some carotid artery stenosis, and obesity with PCOS and some early degenerative arthritis changes.  She previously saw rheumatology in 2021 with iniital treatment on NSAIDs. Lab workup at that time showing elevated CRP and platelets with negative ANA testing and no radiographic sacroiliitis.  She has generally had some degree of ongoing joint pains typically affecting her neck and back and knees and ankles somewhat varying severity.  Knee pain worse on the right side and often provoked with climbing stairs or getting up from the ground or low furniture positions.  Also tends to have increase in neck and hand pains towards the end of the day.  Has had bilateral carpal tunnel syndrome but does not require any interventions for this and no complications with excessive weakness dropping items or difficulty performing routine tasks.  Referral to this visit was associated with development of significant left  wrist pain and swelling with findings of tenosynovitis and orthopedics clinic.  She did not recall any particular injury activity change or illness preceding onset of symptoms started around late January or beginning of February.  Since the left wrist pain improved she is not having large amounts of visible joint swelling although still has some persistent pain and stiffness.  She takes some Tylenol and ibuprofen as needed but not every day and usually not multiple times per day. Skin disease experiences episodic small patchy rashes usually resolving without any specific long-term treatment.  These were previously suspected as psoriasis but never on a long-term medication.  Currently skin is pretty clear did not see a  flareup of rash associated with the left wrist swelling.  With previous rheumatology evaluation her findings with irregular brittle and thin fingernails with frequent splitting were suspected as related to possible psoriatic disease. Fibromyalgia with frequent pain including muscular stiffness and pain throughout the that stays pretty constantly present.  Usually does not feel widespread sensitivity all over or excessive pain with benign stimuli.  Does have issues with silent migraine pretty often.  Has irritable bowel symptoms predominantly diarrhea.  She was started on Cymbalta  takes 20 mg daily feels this is partially beneficial for her body aches.     No Rheumatology ROS completed.   PMFS History:  Patient Active Problem List   Diagnosis Date Noted   Chronic diarrhea 11/13/2023   Rectal bleeding 11/13/2023   Gastroesophageal reflux disease without esophagitis 11/13/2023   Gastritis and gastroduodenitis 11/13/2023   High risk medication use 10/07/2023   Other fatigue 04/25/2023   Mixed hyperlipidemia 04/25/2023   Prediabetes 04/25/2023   Vitamin D  deficiency 04/25/2023   Myofascial pain syndrome 04/03/2023   Patellofemoral pain syndrome of right knee 04/03/2023   Abscess of skin and subcutaneous tissue 10/13/2022   Carotid artery stenosis, asymptomatic, left 09/18/2022   Generalized anxiety disorder 04/18/2022   Menstrual periods irregular 03/03/2022   Anemia due to folic acid  deficiency 03/03/2022   Recurrent major depressive disorder, in partial remission 03/03/2022   Class 3 severe obesity due to excess calories without serious comorbidity with body mass index (BMI) of 50.0 to 59.9 in adult [facility]) 02/15/2022   Irritable bowel syndrome with diarrhea 01/27/2022   H/O atrial fibrillation without current medication 01/27/2022   Lymphedema 01/27/2022   Polycystic ovary disease 01/27/2022   BMI 50.0-59.9, adult (HCC) 01/27/2022   Fibromyalgia 01/25/2022   Hypertension 01/25/2022    Atrial fibrillation (HCC) 07/07/2021   Anxiety 07/07/2021   Asthma 07/07/2021   Psoriatic arthritis (HCC) 02/10/2021   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, right 10/31/2020   Dizziness 07/10/2020   Pure hypercholesterolemia 07/10/2020   Hereditary lymphedema 07/05/2019   Biliary dyskinesia 04/16/2017   Endometrial polyp 08/27/2016   Menometrorrhagia 08/27/2016   Cervical radiculitis 10/30/2015   Carpal tunnel syndrome 07/03/2015    Past Medical History:  Diagnosis Date   A-fib (HCC)    Anemia    Anxiety    Arthritis    Asthma    Depression    Fibromyalgia    GERD (gastroesophageal reflux disease)    High cholesterol    Hypertension    IBS (irritable bowel syndrome)    Lymphedema    Obesity    PCOS (polycystic ovarian syndrome)    Prediabetes     Family History  Problem Relation Age of Onset   Dementia Mother    Breast cancer Mother  onset 85, 2nd 2016   Diabetes Mother    Alzheimer's disease Mother    Heart disease Father    Diabetes Father    Irritable bowel syndrome Father    Breast cancer Maternal Aunt        onset 13   Colon cancer Maternal Great-grandfather    Breast cancer Other        maternal first cousin, onset 77   Esophageal cancer Neg Hx    Stomach cancer Neg Hx    Past Surgical History:  Procedure Laterality Date   BIOPSY  11/13/2023   Procedure: BIOPSY;  Surgeon: Shila Gustav GAILS, MD;  Location: [facility];  Service: Gastroenterology;;   CHOLECYSTECTOMY     COLONOSCOPY     with endoscopy 2023   COLONOSCOPY WITH PROPOFOL  N/A 11/13/2023   Procedure: COLONOSCOPY WITH PROPOFOL ;  Surgeon: Shila Gustav GAILS, MD;  Location: [facility];  Service: Gastroenterology;  Laterality: N/A;   DILATION AND CURETTAGE OF UTERUS     ESOPHAGOGASTRODUODENOSCOPY (EGD) WITH PROPOFOL  N/A 11/13/2023   Procedure: ESOPHAGOGASTRODUODENOSCOPY (EGD) WITH PROPOFOL ;  Surgeon: Shila Gustav GAILS, MD;  Location: [facility];  Service: Gastroenterology;   Laterality: N/A;   GALLBLADDER SURGERY  2018   Social History   Social History Narrative   Tobacco use, amount per day now: No   Past tobacco use, amount per day: No   How many years did you use tobacco: No   Alcohol use (drinks per week): No   Diet:   Do you drink/eat things with caffeine: Yes   Marital status:   Married                               What year were you married? 2022   Do you live in a house, apartment, assisted living, condo, trailer, etc.? Apartment   Is it one or more stories? 3   How many persons live in your home? 2   Do you have pets in your home?( please list) 2 Cats, 1 Dog   Highest Level of education completed? Bachelors Degree   Current or past profession: Art    Do you exercise?       No                           Type and how often?   Do you have a living will? No   Do you have a DNR form?    No                               If not, do you want to discuss one?   Do you have signed POA/HPOA forms No                        If so, please bring to you appointment      Do you have any difficulty bathing or dressing yourself? No   Do you have any difficulty preparing food or eating? No   Do you have any difficulty managing your medications? No   Do you have any difficulty managing your finances? No   Do you have any difficulty affording your medications? No    There is no immunization history on file for this patient.   Objective: Vital Signs: There were no vitals taken for this visit.  Physical Exam   Musculoskeletal Exam: ***   Investigation: No additional findings.  Imaging: No results found.  Recent Labs: Lab Results  Component Value Date   WBC 8.9 08/14/2023   HGB 12.3 08/14/2023   PLT 464 (H) 08/14/2023   NA 138 08/14/2023   K 4.7 08/14/2023   CL 102 08/14/2023   CO2 30 08/14/2023   GLUCOSE 93 08/14/2023   BUN 12 08/14/2023   CREATININE 0.72 08/14/2023   BILITOT 0.4 08/14/2023   ALKPHOS 113 03/27/2023   AST 14 08/14/2023    ALT 12 08/14/2023   PROT 7.3 08/14/2023   ALBUMIN 4.3 03/27/2023   CALCIUM  9.4 08/14/2023    Speciality Comments: No specialty comments available.  Procedures:  No procedures performed Allergies: Aminophylline, Cefaclor, Cephalosporins, Diphenhydramine, Sulfa antibiotics, Clarithromycin, Iodinated contrast media, and Iodine   Assessment / Plan:     Visit Diagnoses:  Assessment & Plan Psoriatic arthritis (HCC)     Myofascial pain syndrome      ***  Follow-Up Instructions: No follow-ups on file.   Marquis Down M Camarie Mctigue, CMA  Note - This record has been created using Animal nutritionist.  Chart creation errors have been sought, but may not always  have been located. Such creation errors do not reflect on  the standard of medical care. "

## 2025-01-06 NOTE — Assessment & Plan Note (Signed)
 SABRA

## 2025-01-06 NOTE — Assessment & Plan Note (Signed)
 Phyllis Gentry

## 2025-01-19 ENCOUNTER — Other Ambulatory Visit: Payer: Self-pay | Admitting: Radiology

## 2025-01-19 ENCOUNTER — Ambulatory Visit: Admitting: Internal Medicine

## 2025-01-19 DIAGNOSIS — N914 Secondary oligomenorrhea: Secondary | ICD-10-CM

## 2025-01-19 DIAGNOSIS — M7918 Myalgia, other site: Secondary | ICD-10-CM

## 2025-01-19 DIAGNOSIS — L4050 Arthropathic psoriasis, unspecified: Secondary | ICD-10-CM

## 2025-01-20 ENCOUNTER — Encounter: Payer: Self-pay | Admitting: Orthopedic Surgery

## 2025-01-20 ENCOUNTER — Ambulatory Visit: Admitting: Orthopedic Surgery

## 2025-01-20 VITALS — BP 128/88 | HR 92 | Temp 97.2°F | Ht 64.0 in | Wt 300.6 lb

## 2025-01-20 DIAGNOSIS — Z6841 Body Mass Index (BMI) 40.0 and over, adult: Secondary | ICD-10-CM

## 2025-01-20 DIAGNOSIS — E66813 Obesity, class 3: Secondary | ICD-10-CM

## 2025-01-20 DIAGNOSIS — Z7985 Long-term (current) use of injectable non-insulin antidiabetic drugs: Secondary | ICD-10-CM | POA: Diagnosis not present

## 2025-01-20 DIAGNOSIS — E1165 Type 2 diabetes mellitus with hyperglycemia: Secondary | ICD-10-CM | POA: Diagnosis not present

## 2025-01-20 MED ORDER — TIRZEPATIDE 2.5 MG/0.5ML ~~LOC~~ SOAJ: 2.5000 mg | SUBCUTANEOUS | 3 refills | Status: AC

## 2025-01-20 NOTE — Telephone Encounter (Signed)
 Med refill request: norethindrone  (MICRONOR ) 0.35 MG tablet  Start:  05/25/24 Disp:  84 tablets Refills:  4  Last AEX:  12/30/23 Next OV:  02/01/25 Next AEX:  Not yet scheduled Last MMG (if hormonal med):  N/A Refill authorized? Please Advise.

## 2025-01-21 ENCOUNTER — Ambulatory Visit: Payer: Self-pay | Admitting: Orthopedic Surgery

## 2025-01-21 LAB — BASIC METABOLIC PANEL WITHOUT GFR
BUN: 14 mg/dL (ref 7–25)
CO2: 31 mmol/L (ref 20–32)
Calcium: 9.5 mg/dL (ref 8.6–10.2)
Chloride: 101 mmol/L (ref 98–110)
Creat: 0.67 mg/dL (ref 0.50–0.99)
Glucose, Bld: 90 mg/dL (ref 65–139)
Potassium: 4.1 mmol/L (ref 3.5–5.3)
Sodium: 139 mmol/L (ref 135–146)

## 2025-01-21 LAB — HEMOGLOBIN A1C
Hgb A1c MFr Bld: 6.1 % — ABNORMAL HIGH
Mean Plasma Glucose: 128 mg/dL
eAG (mmol/L): 7.1 mmol/L

## 2025-01-21 NOTE — Telephone Encounter (Signed)
 I have also been looking at what GLP-1 are covered by medicaid. Appears Wegovy  might have better coverage. I have reached out to [facility] pharmacist for assistance. I will let you know what they recommend. I have found that [facility] pharmacy has better pricing than commercial pharmacies like [facility] and [facility].

## 2025-01-21 NOTE — Progress Notes (Signed)
 "   Careteam: Patient Care Team: Gil Greig BRAVO, NP as PCP - General (Adult Health Nurse Practitioner) Lonni Slain, MD as PCP - Cardiology (Cardiology) Ginette Shasta NOVAK, NP as Nurse Practitioner (Obstetrics and Gynecology) Genelle Standing, MD as Consulting Physician (Orthopedic Surgery) Lonni Slain, MD as Consulting Physician (Cardiology)  Seen by: [name], AGNP-C  PLACE OF SERVICE:  [facility]  Advanced Directive information    Allergies[1]  Chief Complaint  Patient presents with   Medication management of chronic issues     HPI: Patient is a 39 y.o. female seen today for medical management of chronic conditions.   Discussed the use of AI scribe software for clinical note transcription with the patient, who gave verbal consent to proceed.  History of Present Illness   Phyllis Gentry is a 39 year old female with diabetes who presents with concerns about blood sugar levels and medication management.  She experiences episodes of waking up feeling extremely thirsty and hungry, with blood sugar readings in the 130s, which is higher than her usual levels. Throughout the day, her blood sugar levels typically normalize to around 80-90. She is unsure if her current medication, metformin , needs adjustment. Her last A1c test was two years ago, showing a level of 6.4. She has been on metformin  for years and tolerates it well.  She has an appointment with a gynecologist due to not having her period for years but experiencing cramping and a feeling of fullness. She notes that everything has been normal in past evaluations.  She has been off Prozac and is currently on Cymbalta , which she finds effective for her chronic pain. She uses a vibration plate daily to manage swelling and improve her lymphatic system.  Her diet includes a lot of frozen dinners, particularly [employer], and she enjoys pizza. For lunch, she typically has yogurt with protein granola. She  drinks a lot of water and limits soda intake to two or three cans a week.  Family history includes Hashimoto's disease, but she has not experienced thyroid  issues herself. No history of thyroid  cancer. She is experiencing financial struggles as her husband is currently in school for advanced esthetician courses.       Review of Systems:  Review of Systems  Constitutional: Negative.   HENT: Negative.    Eyes: Negative.   Respiratory: Negative.    Cardiovascular: Negative.   Gastrointestinal: Negative.   Genitourinary: Negative.   Musculoskeletal:  Positive for myalgias.  Skin: Negative.   Neurological: Negative.   Endo/Heme/Allergies:  Positive for polydipsia.  Psychiatric/Behavioral:  Positive for depression.     Past Medical History:  Diagnosis Date   A-fib (HCC)    Allergy    Anemia    Anxiety    Arthritis    Asthma    Depression    Fibromyalgia    GERD (gastroesophageal reflux disease)    High cholesterol    Hypertension    IBS (irritable bowel syndrome)    Lymphedema    Obesity    PCOS (polycystic ovarian syndrome)    Prediabetes    Past Surgical History:  Procedure Laterality Date   BIOPSY  11/13/2023   Procedure: BIOPSY;  Surgeon: Shila Gustav GAILS, MD;  Location: [facility];  Service: Gastroenterology;;   CHOLECYSTECTOMY     COLONOSCOPY     with endoscopy 2023   COLONOSCOPY WITH PROPOFOL  N/A 11/13/2023   Procedure: COLONOSCOPY WITH PROPOFOL ;  Surgeon: Shila Gustav GAILS, MD;  Location: [facility];  Service: Gastroenterology;  Laterality: N/A;   DILATION AND CURETTAGE OF UTERUS     ESOPHAGOGASTRODUODENOSCOPY (EGD) WITH PROPOFOL  N/A 11/13/2023   Procedure: ESOPHAGOGASTRODUODENOSCOPY (EGD) WITH PROPOFOL ;  Surgeon: Shila Gustav GAILS, MD;  Location: [facility];  Service: Gastroenterology;  Laterality: N/A;   GALLBLADDER SURGERY  2018   Social History:   reports that she has never smoked. She has been exposed to tobacco smoke. She has never used  smokeless tobacco. She reports that she does not currently use alcohol. She reports that she does not currently use drugs.  Family History  Problem Relation Age of Onset   Dementia Mother    Breast cancer Mother        onset 66, 2nd 2016   Diabetes Mother    Alzheimer's disease Mother    Anxiety disorder Mother    Arthritis Mother    Asthma Mother    Depression Mother    Hypertension Mother    Varicose Veins Mother    Heart disease Father    Diabetes Father    Irritable bowel syndrome Father    Arthritis Father    Depression Father    Early death Father    Hypertension Father    Obesity Father    Varicose Veins Father    Breast cancer Maternal Aunt        onset 95   Colon cancer Maternal Great-grandfather    Breast cancer Other        maternal first cousin, onset 25   Esophageal cancer Neg Hx    Stomach cancer Neg Hx     Medications: Patient's Medications  New Prescriptions   TIRZEPATIDE  (MOUNJARO ) 2.5 MG/0.5ML PEN    Inject 2.5 mg into the skin once a week.  Previous Medications   ALBUTEROL  (PROVENTIL ) (2.5 MG/3ML) 0.083% NEBULIZER SOLUTION    Take 3 mLs (2.5 mg total) by nebulization every 6 (six) hours as needed for wheezing or shortness of breath.   ALBUTEROL  (VENTOLIN  HFA) 108 (90 BASE) MCG/ACT INHALER    INHALE 2 PUFFS BY MOUTH EVERY 4 HOURS AS NEEDED FOR WHEEZING OR SHORTNESS OF BREATH   CALCIUM  PANTOTHENATE (VITAMIN B5 PO)    Take 500 mg by mouth daily.   CLINDAMYCIN  (CLEOCIN  T) 1 % LOTION    Apply topically as needed.   CO-ENZYME Q-10 30 MG CAPSULE    Take 100 mg by mouth 2 (two) times daily.   CYANOCOBALAMIN 1000 MCG SUBL    PLACE 1 TABLET(S) EVERY DAY BY SUBLINGUAL ROUTE.   DICYCLOMINE  (BENTYL ) 20 MG TABLET    Take 1 tablet (20 mg total) by mouth every 6 (six) hours as needed for spasms.   DULOXETINE  (CYMBALTA ) 20 MG CAPSULE    Take 40 mg by mouth daily.   FEXOFENADINE (ALLEGRA) 180 MG TABLET       FOLIC ACID  (FOLVITE ) 1 MG TABLET    TAKE 1 TABLET(1 MG) BY  MOUTH DAILY   IBUPROFEN (ADVIL) 800 MG TABLET    Take 800 mg by mouth every 8 (eight) hours as needed.   MELEYA  0.35 MG TABLET    TAKE 1 TABLET(0.35 MG) BY MOUTH DAILY   METFORMIN  (GLUCOPHAGE -XR) 500 MG 24 HR TABLET    TAKE 1 TABLET(500 MG) BY MOUTH DAILY WITH BREAKFAST   OMEPRAZOLE-SODIUM BICARBONATE (ZEGERID) 20-1100 MG CAPS CAPSULE    Take 1 capsule by mouth at bedtime.   PROPRANOLOL  (INDERAL ) 40 MG TABLET    TAKE 1 TABLET(40 MG) BY MOUTH TWICE DAILY   PYRIDOXINE (VITAMIN B6) 100  MG TABLET    Take 100 mg by mouth daily.   PYRIDOXINE (VITAMIN B6) 100 MG TABLET    Take 100 mg by mouth daily.   ROSUVASTATIN  (CRESTOR ) 20 MG TABLET    Take 1 tablet (20 mg total) by mouth daily.   TRIAMCINOLONE  CREAM (KENALOG ) 0.1 %    Apply 1 Application topically 2 (two) times daily.   ZINC GLUCONATE 50 MG TABLET    Take 100 mg by mouth daily.  Modified Medications   No medications on file  Discontinued Medications   CYCLOBENZAPRINE  (FLEXERIL ) 10 MG TABLET    Take 1 tablet (10 mg total) by mouth at bedtime as needed.   FLUOXETINE (PROZAC) 20 MG CAPSULE    Take 20 mg by mouth daily.   OMEGA-3 FATTY ACIDS (FISH OIL PO)    Take by mouth.   PANTOPRAZOLE  (PROTONIX ) 40 MG TABLET    Take 1 tablet (40 mg total) by mouth daily.    Physical Exam:  Vitals:   01/20/25 1509  BP: 128/88  Pulse: 92  Temp: (!) 97.2 F (36.2 C)  SpO2: 97%  Weight: (!) 300 lb 9.6 oz (136.4 kg)  Height: 5' 4 (1.626 m)   Body mass index is 51.6 kg/m. Wt Readings from Last 3 Encounters:  01/20/25 (!) 300 lb 9.6 oz (136.4 kg)  08/05/24 (!) 305 lb (138.3 kg)  07/21/24 (!) 302 lb (137 kg)    Physical Exam Vitals reviewed.  Constitutional:      General: She is not in acute distress.    Appearance: She is obese.  HENT:     Head: Normocephalic.  Eyes:     General:        Right eye: No discharge.        Left eye: No discharge.  Neurological:     Mental Status: She is alert.     Labs reviewed: Basic Metabolic  Panel: Recent Labs    01/20/25 1556  NA 139  K 4.1  CL 101  CO2 31  GLUCOSE 90  BUN 14  CREATININE 0.67  CALCIUM  9.5   Liver Function Tests: No results for input(s): AST, ALT, ALKPHOS, BILITOT, PROT, ALBUMIN in the last 8760 hours. No results for input(s): LIPASE, AMYLASE in the last 8760 hours. No results for input(s): AMMONIA in the last 8760 hours. CBC: No results for input(s): WBC, NEUTROABS, HGB, HCT, MCV, PLT in the last 8760 hours. Lipid Panel: No results for input(s): CHOL, HDL, LDLCALC, TRIG, CHOLHDL, LDLDIRECT in the last 8760 hours. TSH: No results for input(s): TSH in the last 8760 hours. A1C: Lab Results  Component Value Date   HGBA1C 6.1 (H) 01/20/2025     Assessment/Plan 1. Type 2 diabetes mellitus with hyperglycemia, without long-term current use of insulin (HCC) (Primary) - H/o HTN, HLD - reports increased thirst, home sugars 130's  - A1c 6.1> was 6.4 - obesity contributing factor - will try to start mounjaro  and stop metformin   - cont diet low in carbs and sugars - recheck A1c and urine microalbumin in 3 months  - tirzepatide  (MOUNJARO ) 2.5 MG/0.5ML Pen; Inject 2.5 mg into the skin once a week.  Dispense: 2 mL; Refill: 3 - Hemoglobin A1c - Basic Metabolic Panel with eGFR  2. Class 3 severe obesity due to excess calories without serious comorbidity with body mass index (BMI) of 50.0 to 59.9 in adult [facility]) - see above - BMI 51.60 - recommend GLP-1 due to obesity and T2DM  - tirzepatide  (MOUNJARO )  2.5 MG/0.5ML Pen; Inject 2.5 mg into the skin once a week.  Dispense: 2 mL; Refill: 3  Total time: 32 minutes. Greater than 50% of total time spent doing patient education regarding T2DM, obesity and weight loss including symptom/medication management.    Next appt: 04/28/2025  [name] BODILY  [facility] [phone]      [1]  Allergies Allergen Reactions   Aminophylline  Anaphylaxis   Cefaclor Anaphylaxis and Other (See Comments)   Cephalosporins Anaphylaxis   Diphenhydramine Anaphylaxis   Sulfa Antibiotics Anaphylaxis   Clarithromycin    Iodinated Contrast Media    Iodine     Other reaction(s): Unknown   "

## 2025-01-21 NOTE — Telephone Encounter (Signed)
 If you would like, I can send monjuaro script to [facility] at [place] to compare.

## 2025-01-25 ENCOUNTER — Other Ambulatory Visit: Payer: Self-pay | Admitting: Orthopedic Surgery

## 2025-01-25 DIAGNOSIS — K580 Irritable bowel syndrome with diarrhea: Secondary | ICD-10-CM

## 2025-01-25 DIAGNOSIS — Z6841 Body Mass Index (BMI) 40.0 and over, adult: Secondary | ICD-10-CM

## 2025-01-25 DIAGNOSIS — E1165 Type 2 diabetes mellitus with hyperglycemia: Secondary | ICD-10-CM

## 2025-01-25 MED ORDER — WEGOVY 0.25 MG/0.5ML ~~LOC~~ SOAJ
0.2500 mg | SUBCUTANEOUS | 4 refills | Status: DC
  Filled 2025-01-25: qty 2, 28d supply, fill #0

## 2025-01-25 MED ORDER — DICYCLOMINE HCL 20 MG PO TABS: 20.0000 mg | ORAL_TABLET | Freq: Four times a day (QID) | ORAL | 1 refills | Status: AC | PRN

## 2025-01-25 NOTE — Telephone Encounter (Signed)
 Dicyclomine  refilled with [facility]. I sent prescription for Wegovy  to [facility] on [address]. You may call them or visit about pricing. Appears Wegovy  should have better coverage with your insurance. Please keep me updated. Have a great night, Dajane Valli

## 2025-01-25 NOTE — Telephone Encounter (Signed)
 Dicyclomine  refilled with [facility]. I sent prescription for Wegovy  to [facility] on [address]. You may call them or visit about pricing. Appears Wegovy  should have better coverage with your insurance. Please keep me updated. Have a great night, Phyllis Gentry

## 2025-01-26 ENCOUNTER — Other Ambulatory Visit: Payer: Self-pay | Admitting: Orthopedic Surgery

## 2025-01-26 ENCOUNTER — Encounter (HOSPITAL_COMMUNITY): Payer: Self-pay

## 2025-01-26 ENCOUNTER — Other Ambulatory Visit (HOSPITAL_COMMUNITY): Payer: Self-pay

## 2025-01-26 DIAGNOSIS — E1165 Type 2 diabetes mellitus with hyperglycemia: Secondary | ICD-10-CM

## 2025-01-26 DIAGNOSIS — E66813 Obesity, class 3: Secondary | ICD-10-CM

## 2025-01-26 MED ORDER — OZEMPIC (0.25 OR 0.5 MG/DOSE) 2 MG/3ML ~~LOC~~ SOPN
0.2500 mg | PEN_INJECTOR | SUBCUTANEOUS | 4 refills | Status: AC
  Filled 2025-01-26: qty 3, 28d supply, fill #0

## 2025-01-26 NOTE — Progress Notes (Signed)
 Unable to afford Wegovy  due to cost. She has researched insurance options and believes Ozempic  if covered.

## 2025-01-26 NOTE — Telephone Encounter (Signed)
 Prescription for Ozempic  sent to [facility]. Please keep me updated about cost. If this dryg does not work. We may just ave to continue metformin .

## 2025-01-27 ENCOUNTER — Other Ambulatory Visit (HOSPITAL_COMMUNITY): Payer: Self-pay

## 2025-01-27 ENCOUNTER — Other Ambulatory Visit: Payer: Self-pay | Admitting: Radiology

## 2025-01-27 DIAGNOSIS — N914 Secondary oligomenorrhea: Secondary | ICD-10-CM

## 2025-01-27 NOTE — Telephone Encounter (Signed)
.  Med refill request: Phyllis Gentry   Last AEX: 12/30/23 Next: 02/01/25 Last MMG (if hormonal med) Refill authorized: Please Advise?   Last refill was 01/20/25 Refill not appropriate

## 2025-02-01 ENCOUNTER — Ambulatory Visit: Admitting: Radiology

## 2025-04-28 ENCOUNTER — Ambulatory Visit: Admitting: Orthopedic Surgery
